# Patient Record
Sex: Female | Born: 1935 | Race: White | Hispanic: No | Smoking: Never smoker
Health system: Southern US, Community
[De-identification: ages and names within clinical notes are randomized; demographics above are authoritative.]

## PROBLEM LIST (undated history)

## (undated) DIAGNOSIS — E079 Disorder of thyroid, unspecified: Secondary | ICD-10-CM

## (undated) DIAGNOSIS — F329 Major depressive disorder, single episode, unspecified: Secondary | ICD-10-CM

## (undated) DIAGNOSIS — I10 Essential (primary) hypertension: Secondary | ICD-10-CM

## (undated) DIAGNOSIS — H409 Unspecified glaucoma: Secondary | ICD-10-CM

## (undated) DIAGNOSIS — F32A Depression, unspecified: Secondary | ICD-10-CM

## (undated) DIAGNOSIS — G459 Transient cerebral ischemic attack, unspecified: Secondary | ICD-10-CM

## (undated) HISTORY — PX: EYE SURGERY: SHX253

## (undated) HISTORY — PX: OTHER SURGICAL HISTORY: SHX169

## (undated) HISTORY — PX: APPENDECTOMY: SHX54

---

## 2013-04-12 ENCOUNTER — Emergency Department: Payer: Self-pay | Admitting: Emergency Medicine

## 2015-08-31 ENCOUNTER — Encounter: Payer: Self-pay | Admitting: Internal Medicine

## 2015-08-31 ENCOUNTER — Other Ambulatory Visit: Payer: Self-pay | Admitting: Internal Medicine

## 2015-09-18 ENCOUNTER — Encounter: Payer: Self-pay | Admitting: Internal Medicine

## 2015-09-18 ENCOUNTER — Other Ambulatory Visit: Payer: Self-pay | Admitting: Internal Medicine

## 2015-09-18 DIAGNOSIS — H409 Unspecified glaucoma: Secondary | ICD-10-CM | POA: Insufficient documentation

## 2015-09-18 DIAGNOSIS — R600 Localized edema: Secondary | ICD-10-CM | POA: Insufficient documentation

## 2015-09-18 DIAGNOSIS — E039 Hypothyroidism, unspecified: Secondary | ICD-10-CM | POA: Insufficient documentation

## 2015-09-18 DIAGNOSIS — I10 Essential (primary) hypertension: Secondary | ICD-10-CM | POA: Insufficient documentation

## 2015-09-19 ENCOUNTER — Other Ambulatory Visit: Payer: Self-pay | Admitting: Internal Medicine

## 2015-10-19 ENCOUNTER — Other Ambulatory Visit: Payer: Self-pay | Admitting: Internal Medicine

## 2015-10-29 ENCOUNTER — Other Ambulatory Visit: Payer: Self-pay | Admitting: Internal Medicine

## 2015-11-18 ENCOUNTER — Other Ambulatory Visit: Payer: Self-pay | Admitting: Internal Medicine

## 2016-01-06 ENCOUNTER — Inpatient Hospital Stay
Admission: EM | Admit: 2016-01-06 | Discharge: 2016-01-11 | DRG: 305 | Disposition: A | Discharge status: Skilled Nursing Facility | Payer: Medicare Other | Attending: Internal Medicine | Admitting: Internal Medicine

## 2016-01-06 ENCOUNTER — Emergency Department: Payer: Medicare Other

## 2016-01-06 DIAGNOSIS — F0390 Unspecified dementia without behavioral disturbance: Secondary | ICD-10-CM | POA: Diagnosis present

## 2016-01-06 DIAGNOSIS — I10 Essential (primary) hypertension: Secondary | ICD-10-CM | POA: Diagnosis not present

## 2016-01-06 DIAGNOSIS — E039 Hypothyroidism, unspecified: Secondary | ICD-10-CM | POA: Diagnosis present

## 2016-01-06 DIAGNOSIS — I639 Cerebral infarction, unspecified: Secondary | ICD-10-CM | POA: Diagnosis not present

## 2016-01-06 DIAGNOSIS — Z79899 Other long term (current) drug therapy: Secondary | ICD-10-CM

## 2016-01-06 DIAGNOSIS — Z9049 Acquired absence of other specified parts of digestive tract: Secondary | ICD-10-CM

## 2016-01-06 DIAGNOSIS — E876 Hypokalemia: Secondary | ICD-10-CM | POA: Diagnosis present

## 2016-01-06 DIAGNOSIS — Z823 Family history of stroke: Secondary | ICD-10-CM

## 2016-01-06 DIAGNOSIS — Z9889 Other specified postprocedural states: Secondary | ICD-10-CM

## 2016-01-06 DIAGNOSIS — F32A Depression, unspecified: Secondary | ICD-10-CM | POA: Diagnosis present

## 2016-01-06 DIAGNOSIS — Z66 Do not resuscitate: Secondary | ICD-10-CM | POA: Diagnosis present

## 2016-01-06 DIAGNOSIS — F329 Major depressive disorder, single episode, unspecified: Secondary | ICD-10-CM | POA: Diagnosis present

## 2016-01-06 DIAGNOSIS — H409 Unspecified glaucoma: Secondary | ICD-10-CM | POA: Diagnosis present

## 2016-01-06 DIAGNOSIS — E860 Dehydration: Secondary | ICD-10-CM | POA: Diagnosis present

## 2016-01-06 DIAGNOSIS — N390 Urinary tract infection, site not specified: Secondary | ICD-10-CM | POA: Diagnosis present

## 2016-01-06 HISTORY — DX: Unspecified glaucoma: H40.9

## 2016-01-06 HISTORY — DX: Disorder of thyroid, unspecified: E07.9

## 2016-01-06 HISTORY — DX: Depression, unspecified: F32.A

## 2016-01-06 HISTORY — DX: Major depressive disorder, single episode, unspecified: F32.9

## 2016-01-06 HISTORY — DX: Essential (primary) hypertension: I10

## 2016-01-06 MED ORDER — ONDANSETRON HCL 4 MG/2ML IJ SOLN
4.0000 mg | Freq: Once | INTRAMUSCULAR | Status: AC
  Administered 2016-01-06: 4 mg via INTRAVENOUS
  Filled 2016-01-06: qty 2

## 2016-01-06 MED ORDER — MORPHINE SULFATE (PF) 2 MG/ML IV SOLN
2.0000 mg | Freq: Once | INTRAVENOUS | Status: AC
  Administered 2016-01-06: 2 mg via INTRAVENOUS
  Filled 2016-01-06: qty 1

## 2016-01-06 NOTE — ED Provider Notes (Signed)
[facility] Emergency Department Provider Note  ____________________________________________  Time seen: 11:40 PM  I have reviewed the triage vital signs and the nursing notes.   HISTORY  Chief Complaint Headache     HPI Connie Love is a 82 y.o. female presents with acute onset of generalized headache" this morning". Patient states current pain score 7 out of 10 and located towards the top of head. Patient denies any weakness or numbness. However the patient's daughter does admit to some noted gait instability over the past few days. Patient denies any history of strokes in the past.    Past Medical History  Diagnosis Date  . Hypertension   . Thyroid disease   . Glaucoma     Patient Active Problem List   Diagnosis Date Noted  . Acquired hypothyroidism 09/18/2015  . Edema extremities 09/18/2015  . Essential (primary) hypertension 09/18/2015  . Glaucoma 09/18/2015    Past Surgical History  Procedure Laterality Date  . Bladder tack    . Appendectomy    . Eye surgery      Current Outpatient Rx  Name  Route  Sig  Dispense  Refill  . dorzolamide-timolol (COSOPT) 22.3-6.8 MG/ML ophthalmic solution      1 drop 2 (two) times daily.         Marland Kitchen levothyroxine (SYNTHROID, LEVOTHROID) 75 MCG tablet      TAKE 1 TABLET BY MOUTH DAILY   30 tablet   0   . losartan-hydrochlorothiazide (HYZAAR) 100-12.5 MG tablet      TAKE 1 TABLET BY MOUTH DAILY   30 tablet   5   . prednisoLONE acetate (PRED FORTE) 1 % ophthalmic suspension   Ophthalmic   Apply to eye.         . sertraline (ZOLOFT) 50 MG tablet   Oral   Take 50 mg by mouth daily.           Allergies No known drug allergies  Family History  Problem Relation Age of Onset  . Stroke Sister     Social History Social History  Substance Use Topics  . Smoking status: Never Smoker   . Smokeless tobacco: None  . Alcohol Use: No    Review of Systems  Constitutional: Negative  for fever. Eyes: Negative for visual changes. ENT: Negative for sore throat. Cardiovascular: Negative for chest pain. Respiratory: Negative for shortness of breath. Gastrointestinal: Negative for abdominal pain, vomiting and diarrhea. Genitourinary: Negative for dysuria. Musculoskeletal: Negative for back pain. Skin: Negative for rash. Neurological: Positive for headache and gait instability   10-point ROS otherwise negative.  ____________________________________________   PHYSICAL EXAM:  VITAL SIGNS: ED Triage Vitals  Enc Vitals Group     BP 01/06/16 1943 176/90 mmHg     Pulse Rate 01/06/16 1943 72     Resp 01/06/16 1943 19     Temp 01/06/16 1943 97.8 F (36.6 C)     Temp Source 01/06/16 1943 Oral     SpO2 01/06/16 1943 96 %     Weight 01/06/16 1943 146 lb 9.7 oz (66.5 kg)     Height --      Head Cir --      Peak Flow --      Pain Score 01/06/16 1946 6     Pain Loc --      Pain Edu? --      Excl. in GC? --      Constitutional: Alert and oriented. Apparent discomfort Eyes: Conjunctivae are  normal. PERRL. Normal extraocular movements. ENT   Head: Normocephalic and atraumatic.   Nose: No congestion/rhinnorhea.   Mouth/Throat: Mucous membranes are moist.   Neck: No stridor. Hematological/Lymphatic/Immunilogical: No cervical lymphadenopathy. Cardiovascular: Normal rate, regular rhythm. Normal and symmetric distal pulses are present in all extremities. No murmurs, rubs, or gallops. Respiratory: Normal respiratory effort without tachypnea nor retractions. Breath sounds are clear and equal bilaterally. No wheezes/rales/rhonchi. Gastrointestinal: Soft and nontender. No distention. There is no CVA tenderness. Genitourinary: deferred Musculoskeletal: Nontender with normal range of motion in all extremities. No joint effusions.  No lower extremity tenderness nor edema. Neurologic:  Normal speech and language. No gross focal neurologic deficits are appreciated.  Speech is normal.  Skin:  Skin is warm, dry and intact. No rash noted. Psychiatric: Mood and affect are normal. Speech and behavior are normal. Patient exhibits appropriate insight and judgment.  ____________________________________________    LABS (pertinent positives/negatives)  Labs Reviewed  BASIC METABOLIC PANEL - Abnormal; Notable for the following:    Sodium 127 (*)    Potassium 2.7 (*)    Chloride 92 (*)    Glucose, Bld 112 (*)    Calcium 8.5 (*)    All other components within normal limits  CBC  TROPONIN I     EKG  ED ECG REPORT I, BROWN, [place] N, the attending physician, personally viewed and interpreted this ECG.   Date: 01/07/2016  EKG Time: 12:23 AM  Rate: 70  Rhythm: Normal sinus rhythm   Axis: none  Intervals: Normal  ST&T Change: None   ____________________________________________    RADIOLOGY  CT Head Wo Contrast (Final result) Result time: 01/06/16 21:10:10   Final result by Rad Results In Interface (01/06/16 21:10:10)   Narrative:   CLINICAL DATA: Acute onset headache with nausea and photophobia  EXAM: CT HEAD WITHOUT CONTRAST  TECHNIQUE: Contiguous axial images were obtained from the base of the skull through the vertex without intravenous contrast.  COMPARISON: None.  FINDINGS: There is moderate diffuse atrophy. There is no intracranial mass, hemorrhage, extra-axial fluid collection, or midline shift. There is patchy small vessel disease in the centra semiovale bilaterally. There is evidence of prior small infarcts in each globus pallidum region. There is an infarct involving the posterior limb of the left external capsule. There is decreased attenuation involving portions of the left claustrum and extreme capsule, best appreciated on axial slice 18 series 2. This decreased attenuation is consistent with an age uncertain infarct. No other findings indicative of potential acute infarct appreciated. The bony calvarium appears  intact. The mastoid air cells are clear. Visualized orbital regions appear normal.  IMPRESSION: Age uncertain small infarct involving the left claustrum and extreme capsule. Nearby prior infarct in the left lateral basal ganglia. Small prior infarct in the inferior right posterior lentiform nucleus region. There is atrophy with moderate patchy periventricular small vessel disease. No hemorrhage or mass effect. No midline shift.   Electronically Signed By: Bretta Bang III M.D. On: 01/06/2016 21:10        INITIAL IMPRESSION / ASSESSMENT AND PLAN / ED COURSE  Pertinent labs & imaging results that were available during my care of the patient were reviewed by me and considered in my medical decision making (see chart for details).  Patient discussed with Dr. Anne Hahn for hospital admission for further evaluation and management  ____________________________________________   FINAL CLINICAL IMPRESSION(S) / ED DIAGNOSES  Final diagnoses:  Cerebral infarction due to unspecified mechanism      Darci Current, MD  01/07/16 0129 

## 2016-01-06 NOTE — ED Notes (Signed)
Pt returned from CT via stretcher.

## 2016-01-06 NOTE — ED Notes (Addendum)
Pt presents via [facility] with sudden onset headache. Pt states headache began today. States headache is on top of head, radiates down neck. Pt states nauseous, pt states light and sound make pain worse. Pt states she is almost blind so unable to tell this RN is blurred or double vision. Pt is hard of hearing. Per EMS pt has hx of HTN

## 2016-01-07 ENCOUNTER — Inpatient Hospital Stay
Admit: 2016-01-07 | Discharge: 2016-01-07 | Disposition: A | Discharge status: Home / Self Care | Payer: Medicare Other | Attending: Internal Medicine | Admitting: Internal Medicine

## 2016-01-07 ENCOUNTER — Inpatient Hospital Stay: Payer: Medicare Other

## 2016-01-07 ENCOUNTER — Encounter: Payer: Self-pay | Admitting: Internal Medicine

## 2016-01-07 DIAGNOSIS — E860 Dehydration: Secondary | ICD-10-CM | POA: Diagnosis present

## 2016-01-07 DIAGNOSIS — Z9889 Other specified postprocedural states: Secondary | ICD-10-CM | POA: Diagnosis not present

## 2016-01-07 DIAGNOSIS — I639 Cerebral infarction, unspecified: Secondary | ICD-10-CM | POA: Diagnosis present

## 2016-01-07 DIAGNOSIS — N390 Urinary tract infection, site not specified: Secondary | ICD-10-CM | POA: Diagnosis present

## 2016-01-07 DIAGNOSIS — F329 Major depressive disorder, single episode, unspecified: Secondary | ICD-10-CM | POA: Diagnosis present

## 2016-01-07 DIAGNOSIS — E039 Hypothyroidism, unspecified: Secondary | ICD-10-CM | POA: Diagnosis present

## 2016-01-07 DIAGNOSIS — H409 Unspecified glaucoma: Secondary | ICD-10-CM | POA: Diagnosis present

## 2016-01-07 DIAGNOSIS — Z79899 Other long term (current) drug therapy: Secondary | ICD-10-CM | POA: Diagnosis not present

## 2016-01-07 DIAGNOSIS — E876 Hypokalemia: Secondary | ICD-10-CM | POA: Diagnosis present

## 2016-01-07 DIAGNOSIS — Z823 Family history of stroke: Secondary | ICD-10-CM | POA: Diagnosis not present

## 2016-01-07 DIAGNOSIS — F0390 Unspecified dementia without behavioral disturbance: Secondary | ICD-10-CM | POA: Diagnosis present

## 2016-01-07 DIAGNOSIS — I10 Essential (primary) hypertension: Secondary | ICD-10-CM | POA: Diagnosis present

## 2016-01-07 DIAGNOSIS — Z66 Do not resuscitate: Secondary | ICD-10-CM | POA: Diagnosis present

## 2016-01-07 DIAGNOSIS — Z9049 Acquired absence of other specified parts of digestive tract: Secondary | ICD-10-CM | POA: Diagnosis not present

## 2016-01-07 DIAGNOSIS — F32A Depression, unspecified: Secondary | ICD-10-CM | POA: Diagnosis present

## 2016-01-07 LAB — COMPREHENSIVE METABOLIC PANEL
ALT: 15 U/L (ref 14–54)
AST: 22 U/L (ref 15–41)
Albumin: 3.9 g/dL (ref 3.5–5.0)
Alkaline Phosphatase: 65 U/L (ref 38–126)
Anion gap: 9 (ref 5–15)
BUN: 20 mg/dL (ref 6–20)
CHLORIDE: 92 mmol/L — AB (ref 101–111)
CO2: 27 mmol/L (ref 22–32)
CREATININE: 0.47 mg/dL (ref 0.44–1.00)
Calcium: 8.7 mg/dL — ABNORMAL LOW (ref 8.9–10.3)
GFR calc non Af Amer: >60 mL/min (ref >60)
Glucose, Bld: 109 mg/dL — ABNORMAL HIGH (ref 65–99)
Potassium: 3.3 mmol/L — ABNORMAL LOW (ref 3.5–5.1)
SODIUM: 128 mmol/L — AB (ref 135–145)
Total Bilirubin: 1.2 mg/dL (ref 0.3–1.2)
Total Protein: 6.4 g/dL — ABNORMAL LOW (ref 6.5–8.1)

## 2016-01-07 LAB — BASIC METABOLIC PANEL
ANION GAP: 7 (ref 5–15)
BUN: 20 mg/dL (ref 6–20)
CHLORIDE: 92 mmol/L — AB (ref 101–111)
CO2: 28 mmol/L (ref 22–32)
CREATININE: 0.49 mg/dL (ref 0.44–1.00)
Calcium: 8.5 mg/dL — ABNORMAL LOW (ref 8.9–10.3)
GFR calc non Af Amer: >60 mL/min (ref >60)
Glucose, Bld: 112 mg/dL — ABNORMAL HIGH (ref 65–99)
POTASSIUM: 2.7 mmol/L — AB (ref 3.5–5.1)
SODIUM: 127 mmol/L — AB (ref 135–145)

## 2016-01-07 LAB — LIPID PANEL
Cholesterol: 204 mg/dL — ABNORMAL HIGH (ref 0–200)
HDL: 83 mg/dL
LDL Cholesterol: 112 mg/dL — ABNORMAL HIGH (ref 0–99)
Total CHOL/HDL Ratio: 2.5 ratio
Triglycerides: 45 mg/dL
VLDL: 9 mg/dL (ref 0–40)

## 2016-01-07 LAB — CBC
HCT: 37.6 % (ref 35.0–47.0)
HCT: 37.7 % (ref 35.0–47.0)
Hemoglobin: 12.7 g/dL (ref 12.0–16.0)
Hemoglobin: 12.8 g/dL (ref 12.0–16.0)
MCH: 31.2 pg (ref 26.0–34.0)
MCH: 32.2 pg (ref 26.0–34.0)
MCHC: 33.8 g/dL (ref 32.0–36.0)
MCHC: 34 g/dL (ref 32.0–36.0)
MCV: 92.3 fL (ref 80.0–100.0)
MCV: 94.5 fL (ref 80.0–100.0)
PLATELETS: 229 10*3/uL (ref 150–440)
Platelets: 226 10*3/uL (ref 150–440)
RBC: 3.99 MIL/uL (ref 3.80–5.20)
RBC: 4.07 MIL/uL (ref 3.80–5.20)
RDW: 13.1 % (ref 11.5–14.5)
RDW: 13.3 % (ref 11.5–14.5)
WBC: 9.7 10*3/uL (ref 3.6–11.0)
WBC: 9.7 10*3/uL (ref 3.6–11.0)

## 2016-01-07 LAB — HEMOGLOBIN A1C: HEMOGLOBIN A1C: 5.5 % (ref 4.0–6.0)

## 2016-01-07 LAB — TROPONIN I

## 2016-01-07 MED ORDER — ASPIRIN 325 MG PO TABS
325.0000 mg | ORAL_TABLET | Freq: Every day | ORAL | Status: DC
  Administered 2016-01-07 – 2016-01-11 (×5): 325 mg via ORAL
  Filled 2016-01-07 (×5): qty 1

## 2016-01-07 MED ORDER — MORPHINE SULFATE (PF) 2 MG/ML IV SOLN
INTRAVENOUS | Status: AC
  Filled 2016-01-07: qty 1

## 2016-01-07 MED ORDER — LEVOTHYROXINE SODIUM 75 MCG PO TABS
75.0000 ug | ORAL_TABLET | Freq: Every day | ORAL | Status: DC
  Administered 2016-01-07 – 2016-01-11 (×5): 75 ug via ORAL
  Filled 2016-01-07 (×6): qty 1

## 2016-01-07 MED ORDER — BUTALBITAL-APAP-CAFFEINE 50-325-40 MG PO TABS
1.0000 | ORAL_TABLET | Freq: Four times a day (QID) | ORAL | Status: DC | PRN
  Administered 2016-01-08 (×2): 1 via ORAL
  Filled 2016-01-07 (×2): qty 1

## 2016-01-07 MED ORDER — DORZOLAMIDE HCL-TIMOLOL MAL 2-0.5 % OP SOLN
1.0000 [drp] | Freq: Two times a day (BID) | OPHTHALMIC | Status: DC
  Administered 2016-01-07 – 2016-01-11 (×9): 1 [drp] via OPHTHALMIC
  Filled 2016-01-07: qty 10

## 2016-01-07 MED ORDER — ACETAMINOPHEN 325 MG PO TABS
650.0000 mg | ORAL_TABLET | ORAL | Status: DC | PRN
  Administered 2016-01-07 (×2): 650 mg via ORAL
  Filled 2016-01-07: qty 2

## 2016-01-07 MED ORDER — ACETAMINOPHEN 325 MG PO TABS
650.0000 mg | ORAL_TABLET | Freq: Four times a day (QID) | ORAL | Status: DC | PRN
  Administered 2016-01-07: 10:00:00 650 mg via ORAL
  Filled 2016-01-07 (×2): qty 2

## 2016-01-07 MED ORDER — STROKE: EARLY STAGES OF RECOVERY BOOK
Freq: Once | Status: AC
  Administered 2016-01-07: 18:00:00

## 2016-01-07 MED ORDER — ATORVASTATIN CALCIUM 20 MG PO TABS
40.0000 mg | ORAL_TABLET | Freq: Every day | ORAL | Status: DC
  Administered 2016-01-07 – 2016-01-10 (×4): 40 mg via ORAL
  Filled 2016-01-07 (×4): qty 2

## 2016-01-07 MED ORDER — ENOXAPARIN SODIUM 40 MG/0.4ML ~~LOC~~ SOLN
40.0000 mg | SUBCUTANEOUS | Status: DC
  Administered 2016-01-07 – 2016-01-09 (×2): 40 mg via SUBCUTANEOUS
  Filled 2016-01-07 (×4): qty 0.4

## 2016-01-07 MED ORDER — POTASSIUM CHLORIDE 10 MEQ/100ML IV SOLN
10.0000 meq | INTRAVENOUS | Status: DC
  Administered 2016-01-07: 09:00:00 10 meq via INTRAVENOUS
  Filled 2016-01-07 (×4): qty 100

## 2016-01-07 MED ORDER — SERTRALINE HCL 50 MG PO TABS
50.0000 mg | ORAL_TABLET | Freq: Every day | ORAL | Status: DC
  Administered 2016-01-07 – 2016-01-09 (×3): 50 mg via ORAL
  Filled 2016-01-07 (×6): qty 1

## 2016-01-07 MED ORDER — ONDANSETRON HCL 4 MG/2ML IJ SOLN
4.0000 mg | Freq: Once | INTRAMUSCULAR | Status: AC
  Administered 2016-01-07: 4 mg via INTRAVENOUS

## 2016-01-07 MED ORDER — MORPHINE SULFATE (PF) 2 MG/ML IV SOLN
2.0000 mg | Freq: Once | INTRAVENOUS | Status: AC
  Administered 2016-01-07: 2 mg via INTRAVENOUS

## 2016-01-07 MED ORDER — POTASSIUM CHLORIDE CRYS ER 20 MEQ PO TBCR
20.0000 meq | EXTENDED_RELEASE_TABLET | Freq: Two times a day (BID) | ORAL | Status: DC
  Administered 2016-01-07 – 2016-01-11 (×8): 20 meq via ORAL
  Filled 2016-01-07 (×9): qty 1

## 2016-01-07 MED ORDER — PREDNISOLONE ACETATE 1 % OP SUSP
1.0000 [drp] | OPHTHALMIC | Status: DC
  Administered 2016-01-07 – 2016-01-11 (×9): 1 [drp] via OPHTHALMIC
  Filled 2016-01-07: qty 1

## 2016-01-07 MED ORDER — POTASSIUM CHLORIDE 20 MEQ PO PACK
40.0000 meq | PACK | Freq: Two times a day (BID) | ORAL | Status: DC
  Administered 2016-01-07: 40 meq via ORAL
  Filled 2016-01-07 (×2): qty 2

## 2016-01-07 MED ORDER — ONDANSETRON HCL 4 MG/2ML IJ SOLN
INTRAMUSCULAR | Status: AC
  Administered 2016-01-07: 4 mg via INTRAVENOUS
  Filled 2016-01-07: qty 2

## 2016-01-07 NOTE — Progress Notes (Signed)
[facility] - [place] at [facility]   PATIENT NAME: Connie Love    MR#:  [id number]  DATE OF BIRTH:  04-10-1922  SUBJECTIVE:  CHIEF COMPLAINT:   Chief Complaint  Patient presents with  . Headache     Came with severe headache, no Stroke on MRI.     Had Hypokalemia- resolving now.     Pt has some hearing deficit, no dementia, daughter complains of her having generalized weakness and may need placement.   REVIEW OF SYSTEMS:  CONSTITUTIONAL: No fever, fatigue or weakness.  EYES: No blurred or double vision.  EARS, NOSE, AND THROAT: No tinnitus or ear pain. Hearing deficit. RESPIRATORY: No cough, shortness of breath, wheezing or hemoptysis.  CARDIOVASCULAR: No chest pain, orthopnea, edema.  GASTROINTESTINAL: No nausea, vomiting, diarrhea or abdominal pain.  GENITOURINARY: No dysuria, hematuria.  ENDOCRINE: No polyuria, nocturia,  HEMATOLOGY: No anemia, easy bruising or bleeding SKIN: No rash or lesion. MUSCULOSKELETAL: No joint pain or arthritis.   NEUROLOGIC: No tingling, numbness, generalized weakness.  PSYCHIATRY: No anxiety or depression.   ROS  DRUG ALLERGIES:  No Known Allergies  VITALS:  Blood pressure 145/66, pulse 67, temperature 98.4 F (36.9 C), temperature source Oral, resp. rate 18, height 5\' 2"  (1.575 m), weight 61.916 kg (136 lb 8 oz), SpO2 94 %.  PHYSICAL EXAMINATION:  GENERAL:  82 y.o.-year-old patient lying in the bed with no acute distress.  EYES: Pupils equal, round, reactive to light and accommodation. No scleral icterus. Extraocular muscles intact.  HEENT: Head atraumatic, normocephalic. Oropharynx and nasopharynx clear. Hearing deficit.  NECK:  Supple, no jugular venous distention. No thyroid enlargement, no tenderness.  LUNGS: Normal breath sounds bilaterally, no wheezing, rales,rhonchi or crepitation. No use of accessory muscles of respiration.  CARDIOVASCULAR: S1, S2 normal. No murmurs, rubs, or gallops.  ABDOMEN: Soft,  nontender, nondistended. Bowel sounds present. No organomegaly or mass.  EXTREMITIES: No pedal edema, cyanosis, or clubbing.  NEUROLOGIC: Cranial nerves II through XII are intact. Muscle strength 4/5 in all extremities. Sensation intact. Gait not checked.  PSYCHIATRIC: The patient is alert and oriented x 3.  SKIN: No obvious rash, lesion, or ulcer.   Physical Exam LABORATORY PANEL:   CBC  Recent Labs Lab 01/07/16 0851  WBC 9.7  HGB 12.8  HCT 37.7  PLT 226   ------------------------------------------------------------------------------------------------------------------  Chemistries   Recent Labs Lab 01/07/16 0851  NA 128*  K 3.3*  CL 92*  CO2 27  GLUCOSE 109*  BUN 20  CREATININE 0.47  CALCIUM 8.7*  AST 22  ALT 15  ALKPHOS 65  BILITOT 1.2   ------------------------------------------------------------------------------------------------------------------  Cardiac Enzymes  Recent Labs Lab 01/07/16 0007  TROPONINI <0.03   ------------------------------------------------------------------------------------------------------------------  RADIOLOGY:  Ct Head Wo Contrast  01/06/2016  CLINICAL DATA:  Acute onset headache with nausea and photophobia EXAM: CT HEAD WITHOUT CONTRAST TECHNIQUE: Contiguous axial images were obtained from the base of the skull through the vertex without intravenous contrast. COMPARISON:  None. FINDINGS: There is moderate diffuse atrophy. There is no intracranial mass, hemorrhage, extra-axial fluid collection, or midline shift. There is patchy small vessel disease in the centra semiovale bilaterally. There is evidence of prior small infarcts in each globus pallidum region. There is an infarct involving the posterior limb of the left external capsule. There is decreased attenuation involving portions of the left claustrum and extreme capsule, best appreciated on axial slice 18 series 2. This decreased attenuation is consistent with an age uncertain  infarct. No other findings  indicative of potential acute infarct appreciated. The bony calvarium appears intact. The mastoid air cells are clear. Visualized orbital regions appear normal. IMPRESSION: Age uncertain small infarct involving the left claustrum and extreme capsule. Nearby prior infarct in the left lateral basal ganglia. Small prior infarct in the inferior right posterior lentiform nucleus region. There is atrophy with moderate patchy periventricular small vessel disease. No hemorrhage or mass effect. No midline shift. Electronically Signed   By: Bretta Bang III M.D.   On: 01/06/2016 21:10   Mr Brain Wo Contrast  01/07/2016  CLINICAL DATA:  Headaches.  Abnormal CT of the head. EXAM: MRI HEAD WITHOUT CONTRAST MRA HEAD WITHOUT CONTRAST TECHNIQUE: Multiplanar, multiecho pulse sequences of the brain and surrounding structures were obtained without intravenous contrast. Angiographic images of the head were obtained using MRA technique without contrast. COMPARISON:  CT head without contrast 01/05/2006. FINDINGS: MRI HEAD FINDINGS The diffusion-weighted images demonstrate no evidence for acute or subacute infarct. Dilated perivascular spaces and remote infarcts are evident in the inferior aspect of the lentiform nucleus and claustrum. Remote white matter changes are present in the left external capsule. Dilated perivascular spaces are present throughout both basal ganglia. Remote ischemic changes are present of ally. Moderate periventricular and subcortical white matter changes are present bilaterally. The internal auditory canals are within normal limits. Flow is present in the major intracranial arteries. Bilateral lens replacements are present. The globes and orbits are intact. Paranasal sinuses and mastoid air cells are clear. The skullbase is within normal limits. Midline sagittal images demonstrate mild atrophy of the corpus callosum. There is a relatively empty sella. No discrete lesions are  evident. MRA HEAD FINDINGS An atherosclerotic irregularities are present within the cavernous internal carotid arteries bilaterally without a significant focal stenosis. Posterior communicating arteries are present bilaterally. Mild irregularity is present in the distal M1 segments. This is somewhat exaggerated by patient motion. There is moderate attenuation of distal MCA branch vessels bilaterally without a significant proximal stenosis or occlusion on the right. There is mild-to-moderate narrowing of the superior left M2 segment after an initial inferior and to take golf. The right vertebral artery is the dominant vessel. There is moderate narrowing at the origin of the PICA vessels bilaterally. The posterior she  the the basilar artery is normal. There is mild narrowing of the proximal P1 segments bilaterally. Prominent posterior communicating arteries are present bilaterally. There is moderate signal loss in the distal P2 segments bilaterally. This is exaggerated by patient motion through this area. IMPRESSION: 1. No acute intracranial abnormality. Specifically, no evidence for acute left basal ganglia infarct. 2. Remote lacunar infarcts in dilated perivascular spaces are present throughout the basal ganglia bilaterally, particularly within the inferior left lentiform nucleus and external capsule. 3. Moderate diffuse periventricular and subcortical white matter changes bilaterally. These correspond with extensive distal media min small vessel disease on the MRA. 4. Moderate narrowing of the superior division proximal left M2 segment. 5. Moderate narrowing of the PICA origins bilaterally. 6. No other significant proximal stenosis, aneurysm, or branch vessel occlusion. Electronically Signed   By: Marin Roberts M.D.   On: 01/07/2016 13:02   US Carotid Bilateral  01/07/2016  CLINICAL DATA:  82 year old female with stroke-like symptoms and a severe headache EXAM: BILATERAL CAROTID DUPLEX ULTRASOUND  TECHNIQUE: Wallace Cullens scale imaging, color Doppler and duplex ultrasound were performed of bilateral carotid and vertebral arteries in the neck. COMPARISON:  Brain MRI 01/07/16 FINDINGS: Criteria: Quantification of carotid stenosis is based on velocity parameters that  correlate the residual internal carotid diameter with NASCET-based stenosis levels, using the diameter of the distal internal carotid lumen as the denominator for stenosis measurement. The following velocity measurements were obtained: RIGHT ICA:  63/16 cm/sec CCA:  72/11 cm/sec SYSTOLIC ICA/CCA RATIO:  0.9 DIASTOLIC ICA/CCA RATIO:  1.9 ECA:  117 cm/sec LEFT ICA:  63/17 cm/sec CCA:  77/12 cm/sec SYSTOLIC ICA/CCA RATIO:  0.8 DIASTOLIC ICA/CCA RATIO:  1.7 ECA:  131 cm/sec RIGHT CAROTID ARTERY: Trace atherosclerotic plaque in the carotid bulb without evidence of stenosis. RIGHT VERTEBRAL ARTERY:  Patent with antegrade flow. LEFT CAROTID ARTERY: Trace atherosclerotic plaque in the carotid bulb without evidence of stenosis. LEFT VERTEBRAL ARTERY:  Patent with normal antegrade flow. IMPRESSION: 1. Trace atherosclerotic plaque in the carotid bifurcations bilaterally without evidence of stenosis. 2. Vertebral arteries are patent with antegrade flow. Signed, Sterling Big, MD [facility] Electronically Signed   By: Malachy Moan M.D.   On: 01/07/2016 14:30   Mr Maxine Glenn Head/brain Wo Cm  01/07/2016  CLINICAL DATA:  Headaches.  Abnormal CT of the head. EXAM: MRI HEAD WITHOUT CONTRAST MRA HEAD WITHOUT CONTRAST TECHNIQUE: Multiplanar, multiecho pulse sequences of the brain and surrounding structures were obtained without intravenous contrast. Angiographic images of the head were obtained using MRA technique without contrast. COMPARISON:  CT head without contrast 01/05/2006. FINDINGS: MRI HEAD FINDINGS The diffusion-weighted images demonstrate no evidence for acute or subacute infarct. Dilated perivascular  spaces and remote infarcts are evident in the inferior aspect of the lentiform nucleus and claustrum. Remote white matter changes are present in the left external capsule. Dilated perivascular spaces are present throughout both basal ganglia. Remote ischemic changes are present of ally. Moderate periventricular and subcortical white matter changes are present bilaterally. The internal auditory canals are within normal limits. Flow is present in the major intracranial arteries. Bilateral lens replacements are present. The globes and orbits are intact. Paranasal sinuses and mastoid air cells are clear. The skullbase is within normal limits. Midline sagittal images demonstrate mild atrophy of the corpus callosum. There is a relatively empty sella. No discrete lesions are evident. MRA HEAD FINDINGS An atherosclerotic irregularities are present within the cavernous internal carotid arteries bilaterally without a significant focal stenosis. Posterior communicating arteries are present bilaterally. Mild irregularity is present in the distal M1 segments. This is somewhat exaggerated by patient motion. There is moderate attenuation of distal MCA branch vessels bilaterally without a significant proximal stenosis or occlusion on the right. There is mild-to-moderate narrowing of the superior left M2 segment after an initial inferior and to take golf. The right vertebral artery is the dominant vessel. There is moderate narrowing at the origin of the PICA vessels bilaterally. The posterior she  the the basilar artery is normal. There is mild narrowing of the proximal P1 segments bilaterally. Prominent posterior communicating arteries are present bilaterally. There is moderate signal loss in the distal P2 segments bilaterally. This is exaggerated by patient motion through this area. IMPRESSION: 1. No acute intracranial abnormality. Specifically, no evidence for acute left basal ganglia infarct. 2. Remote lacunar infarcts in  dilated perivascular spaces are present throughout the basal ganglia bilaterally, particularly within the inferior left lentiform nucleus and external capsule. 3. Moderate diffuse periventricular and subcortical white matter changes bilaterally. These correspond with extensive distal media min small vessel disease on the MRA. 4. Moderate narrowing of the superior division proximal left M2 segment. 5. Moderate narrowing of the PICA origins bilaterally. 6. No other  significant proximal stenosis, aneurysm, or branch vessel occlusion. Electronically Signed   By: Marin Roberts M.D.   On: 01/07/2016 13:02    ASSESSMENT AND PLAN:   Principal Problem:   Stroke (cerebrum) (HCC) Active Problems:   Acquired hypothyroidism   Essential (primary) hypertension   Glaucoma   Depression   Hypokalemia   Stroke (HCC)  * Headache- suspected stroke- ruled out.   Likely was due to Hypertension.   MRI, and carotid doppler are negative.   Give tylenol and fioricet as needed.  * Essential hypertension   Under control now.    Stop HCTz on dc. May cont Losartan.   * hypokalemia    Was taking Hyzaar- need to stop HCTZ on d/c.    Replaced.  *   Hypothyroidism   Cont replacement  * Glaucoma   Cont Eye drops.   All the records are reviewed and case discussed with Care Management/Social Workerr. Management plans discussed with the patient, family and they are in agreement.  CODE STATUS: DNR  TOTAL TIME TAKING CARE OF THIS PATIENT: 35 minutes.  Discussed with her daughters, she is worried about her generalized weakness.  Get PT eval.   POSSIBLE D/C IN 1-2 DAYS, DEPENDING ON CLINICAL CONDITION.   Altamese Dilling M.D on 01/07/2016   Between 7am to 6pm - Pager - [phone]  After 6pm go to [email or web address] - password [email or web address]  [facility]  Office  [phone]  CC: Primary care physician; Pcp Not In System  Note: This dictation was prepared with Dragon dictation  along with smaller phrase technology. Any transcriptional errors that result from this process are unintentional.

## 2016-01-07 NOTE — Progress Notes (Signed)
*  PRELIMINARY RESULTS* Echocardiogram 2D Echocardiogram has been performed.  Georgann Housekeeper Hege 01/07/2016, 1:01 PM

## 2016-01-07 NOTE — Progress Notes (Signed)
PT Cancellation Note  Patient Details Name: Connie Love MRN: [id number] DOB: 1922/08/27   Cancelled Treatment:    Reason Eval/Treat Not Completed: Other (comment). Pt not feeling well at all per RN. PT order for start tomorrow. Will plan to attempt next date.   Remedy Corporan 01/07/2016, 11:03 AM Elizabeth Palau, PT, DPT [phone]

## 2016-01-07 NOTE — Progress Notes (Signed)
Assumed care of patient from Donivan Scull, RN at [phone]. Bo Mcclintock, RN

## 2016-01-07 NOTE — ED Notes (Signed)
Daughter states pt has been unable to drink the rest of potassium drunk d/t fear of nausea returning. Pt is resting now. Will continue to encourage drinking the rest of potassium drink.

## 2016-01-07 NOTE — ED Notes (Signed)
Pt drank about half of potassium drink. Daughter was instructed to encourage pt to drink rest of potassium drink when pt is ready for more. This RN will monitor pt's progress.

## 2016-01-07 NOTE — H&P (Signed)
[facility] - [place] at [facility]   PATIENT NAME: Connie Love    MR#:  [id number]  DATE OF BIRTH:  [birth year]  DATE OF ADMISSION:  01/06/2016  PRIMARY CARE PHYSICIAN: Pcp Not In System   REQUESTING/REFERRING PHYSICIAN: Manson Passey, MD  CHIEF COMPLAINT:   Chief Complaint  Patient presents with  . Headache    HISTORY OF PRESENT ILLNESS:  Connie Love  is a 82 y.o. female who presents with headache. Patient is unable to give much history, but she came to the ED for very persistent headache. This headache is frontal, and has not been alleviated by over-the-counter medications at home, or initial analgesics here in the ED. Workup in the ED showed old strokes and a suspected new, though read radiographically is age indeterminate stroke. Patient is unable to give much more history, though she denies any focal weakness or sensory deficits. Hospitalists were called for evaluation and workup for stroke.  PAST MEDICAL HISTORY:   Past Medical History  Diagnosis Date  . Hypertension   . Thyroid disease   . Glaucoma   . Depression     PAST SURGICAL HISTORY:   Past Surgical History  Procedure Laterality Date  . Bladder tack    . Appendectomy    . Eye surgery      SOCIAL HISTORY:   Social History  Substance Use Topics  . Smoking status: Never Smoker   . Smokeless tobacco: Not on file  . Alcohol Use: No    FAMILY HISTORY:   Family History  Problem Relation Age of Onset  . Stroke Sister     DRUG ALLERGIES:  No Known Allergies  MEDICATIONS AT HOME:   Prior to Admission medications   Medication Sig Start Date End Date Taking? Authorizing Provider  dorzolamide-timolol (COSOPT) 22.3-6.8 MG/ML ophthalmic solution 1 drop 2 (two) times daily.   Yes Historical Provider, MD  levothyroxine (SYNTHROID, LEVOTHROID) 75 MCG tablet TAKE 1 TABLET BY MOUTH DAILY 10/29/15  Yes Reubin Milan, MD  losartan-hydrochlorothiazide [facility]) 100-12.5 MG tablet TAKE 1  TABLET BY MOUTH DAILY 11/18/15  Yes Reubin Milan, MD  prednisoLONE acetate (PRED FORTE) 1 % ophthalmic suspension Apply to eye.   Yes Historical Provider, MD  sertraline (ZOLOFT) 50 MG tablet Take 50 mg by mouth daily.   Yes Historical Provider, MD    REVIEW OF SYSTEMS:  Review of Systems  Constitutional: Negative for fever, chills, weight loss and malaise/fatigue.  HENT: Positive for hearing loss (chronic and stable). Negative for ear pain and tinnitus.   Eyes: Negative for blurred vision, double vision, pain and redness.  Respiratory: Negative for cough, hemoptysis and shortness of breath.   Cardiovascular: Negative for chest pain, palpitations, orthopnea and leg swelling.  Gastrointestinal: Negative for nausea, vomiting, abdominal pain, diarrhea and constipation.  Genitourinary: Negative for dysuria, frequency and hematuria.  Musculoskeletal: Negative for back pain, joint pain and neck pain.  Skin:       No acne, rash, or lesions  Neurological: Positive for headaches. Negative for dizziness, tremors, focal weakness and weakness.  Endo/Heme/Allergies: Negative for polydipsia. Does not bruise/bleed easily.  Psychiatric/Behavioral: Negative for depression. The patient is not nervous/anxious and does not have insomnia.      VITAL SIGNS:   Filed Vitals:   01/07/16 0030 01/07/16 0100 01/07/16 0130 01/07/16 0200  BP: 139/76 132/69 148/83 140/79  Pulse: 69 68 77 72  Temp:      TempSrc:      Resp: 13 13 12  14  Weight:      SpO2: 95% 93% 96% 96%   Wt Readings from Last 3 Encounters:  01/06/16 66.5 kg (146 lb 9.7 oz)    PHYSICAL EXAMINATION:  Physical Exam  Vitals reviewed. Constitutional: She is oriented to person, place, and time. She appears well-developed and well-nourished. No distress.  HENT:  Head: Normocephalic and atraumatic.  Mouth/Throat: Oropharynx is clear and moist.  Hard of hearing  Eyes: Conjunctivae and EOM are normal. Pupils are equal, round, and reactive to  light. No scleral icterus.  Neck: Normal range of motion. Neck supple. No JVD present. No thyromegaly present.  Cardiovascular: Normal rate, regular rhythm and intact distal pulses.  Exam reveals no gallop and no friction rub.   No murmur heard. Respiratory: Effort normal and breath sounds normal. No respiratory distress. She has no wheezes. She has no rales.  GI: Soft. Bowel sounds are normal. She exhibits no distension. There is no tenderness.  Musculoskeletal: Normal range of motion. She exhibits no edema.  No arthritis, no gout  Lymphadenopathy:    She has no cervical adenopathy.  Neurological: She is alert and oriented to person, place, and time. No cranial nerve deficit.  No dysarthria, no aphasia  Skin: Skin is warm and dry. No rash noted. No erythema.  Psychiatric: She has a normal mood and affect. Her behavior is normal. Judgment and thought content normal.    LABORATORY PANEL:   CBC  Recent Labs Lab 01/07/16 0007  WBC 9.7  HGB 12.7  HCT 37.6  PLT 229   ------------------------------------------------------------------------------------------------------------------  Chemistries   Recent Labs Lab 01/07/16 0007  NA 127*  K 2.7*  CL 92*  CO2 28  GLUCOSE 112*  BUN 20  CREATININE 0.49  CALCIUM 8.5*   ------------------------------------------------------------------------------------------------------------------  Cardiac Enzymes  Recent Labs Lab 01/07/16 0007  TROPONINI <0.03   ------------------------------------------------------------------------------------------------------------------  RADIOLOGY:  Ct Head Wo Contrast  01/06/2016  CLINICAL DATA:  Acute onset headache with nausea and photophobia EXAM: CT HEAD WITHOUT CONTRAST TECHNIQUE: Contiguous axial images were obtained from the base of the skull through the vertex without intravenous contrast. COMPARISON:  None. FINDINGS: There is moderate diffuse atrophy. There is no intracranial mass,  hemorrhage, extra-axial fluid collection, or midline shift. There is patchy small vessel disease in the centra semiovale bilaterally. There is evidence of prior small infarcts in each globus pallidum region. There is an infarct involving the posterior limb of the left external capsule. There is decreased attenuation involving portions of the left claustrum and extreme capsule, best appreciated on axial slice 18 series 2. This decreased attenuation is consistent with an age uncertain infarct. No other findings indicative of potential acute infarct appreciated. The bony calvarium appears intact. The mastoid air cells are clear. Visualized orbital regions appear normal. IMPRESSION: Age uncertain small infarct involving the left claustrum and extreme capsule. Nearby prior infarct in the left lateral basal ganglia. Small prior infarct in the inferior right posterior lentiform nucleus region. There is atrophy with moderate patchy periventricular small vessel disease. No hemorrhage or mass effect. No midline shift. Electronically Signed   By: Bretta Bang III M.D.   On: 01/06/2016 21:10    EKG:   Orders placed or performed during the hospital encounter of 01/06/16  . EKG 12-Lead  . EKG 12-Lead  . ED EKG  . ED EKG  . EKG 12-Lead  . EKG 12-Lead    IMPRESSION AND PLAN:  Principal Problem:   Stroke (cerebrum) (HCC) - admit and pursue  workup for stroke order set. Specifically including MRI/MRA head, carotid Dopplers, lipids, A1c, echocardiogram, therapy evaluations, and neurology consult. Active Problems:   Essential (primary) hypertension - controlled, continue home meds   Hypokalemia - replaced in the ED, we will recheck and monitor, replete as needed   Acquired hypothyroidism - home dose of thyroid replacement   Glaucoma - continue home eyedrops   Depression - continue home meds  All the records are reviewed and case discussed with ED provider. Management plans discussed with the patient and/or  family.  DVT PROPHYLAXIS: SubQ lovenox  GI PROPHYLAXIS: None  ADMISSION STATUS: Inpatient  CODE STATUS: DNR Code Status History    This patient does not have a recorded code status. Please follow your organizational policy for patients in this situation.    Advance Directive Documentation        Most Recent Value   Type of Advance Directive  Out of facility DNR (pink MOST or yellow form), Healthcare Power of Attorney [Carol Jessee]   Pre-existing out of facility DNR order (yellow form or pink MOST form)  Yellow form placed in chart (order not valid for inpatient use), Pink MOST form placed in chart (order not valid for inpatient use)   "MOST" Form in Place?        TOTAL TIME TAKING CARE OF THIS PATIENT: 50 minutes.    Alyshia Kernan FIELDING 01/07/2016, 3:33 AM  [facility]  Office  [phone]  CC: Primary care physician; Pcp Not In System

## 2016-01-07 NOTE — Evaluation (Signed)
Clinical/Bedside Swallow Evaluation Patient Details  Name: Connie Love MRN: [id number] Date of Birth: March 03, 1922  Today's Date: 01/07/2016 Time: SLP Start Time (ACUTE ONLY): 0915 SLP Stop Time (ACUTE ONLY): 1015 SLP Time Calculation (min) (ACUTE ONLY): 60 min  Past Medical History:  Past Medical History  Diagnosis Date  . Hypertension   . Thyroid disease   . Glaucoma   . Depression    Past Surgical History:  Past Surgical History  Procedure Laterality Date  . Bladder tack    . Appendectomy    . Eye surgery     HPI:  Pt is a 82 y.o. female w/ h/o HTN, thyroid issues, and depression who presents with headache. Patient is unable to give much history, but she came to the ED for very persistent headache. This headache is frontal, and has not been alleviated by over-the-counter medications at home, or initial analgesics here in the ED. Workup in the ED showed old strokes and a suspected new, though read radiographically is age indeterminate stroke. Patient is unable to give much more history, though she denies any focal weakness or sensory deficits. Pt denies any speech-language deficits and conversed w/ SLP and Dtr appropriately; NSG agreed. Pt also denies any swallowing problems stating she has swallowed pills using water w/ NSG; NSG agreed. Pt is currently on a regular diet and ate minimally at breakfast d/t n/v feelings; NSG aware.    Assessment / Plan / Recommendation Clinical Impression  Pt appeared to adequately tolerate trials of thin liquids and purees during evaluation and tolerated soft solids w/ no reported problems swallowing such foods at morning meal(per pt, Dtr and staff). Pt exhibited no oral phase deficits w/ po trials as well. She required assistance for feeding d/t vision deficits; followed general aspiration precautions independently. Pt appears at reduced risk for aspiration w/ po's currently; follows aspiration precautions baseline to include small sips when drinking.  Rec. continue w/ current diet as ordered w/ precautions. Pt/family agreed. Discussed use of puree w/ large pills; avoiding large straw w/ Dtr. Pt is severely HOH.     Aspiration Risk   (reduced following aspiration precautions)    Diet Recommendation  regular w/ meats cut and moistened well; thin liquids; aspiration precautions; feeding assistance sec. To vision deficits. Pt is HOH.  Medication Administration: Whole meds with liquid (use of puree if easier)    Other  Recommendations Recommended Consults:  (Dietician as Arther Dames.) Oral Care Recommendations: Oral care BID;Staff/trained caregiver to provide oral care   Follow up Recommendations   (TBD by PT)    Frequency and Duration            Prognosis Prognosis for Safe Diet Advancement: Good Barriers to Reach Goals:  (feelings of n/v)      Swallow Study   General Date of Onset: 01/06/16 HPI: Pt is a 82 y.o. female w/ h/o HTN, thyroid issues, and depression who presents with headache. Patient is unable to give much history, but she came to the ED for very persistent headache. This headache is frontal, and has not been alleviated by over-the-counter medications at home, or initial analgesics here in the ED. Workup in the ED showed old strokes and a suspected new, though read radiographically is age indeterminate stroke. Patient is unable to give much more history, though she denies any focal weakness or sensory deficits. Pt denies any speech-language deficits and conversed w/ SLP and Dtr appropriately; NSG agreed. Pt also denies any swallowing problems stating she has swallowed pills  using water w/ NSG; NSG agreed. Pt is currently on a regular diet and ate minimally at breakfast d/t n/v feelings; NSG aware.  Type of Study: Bedside Swallow Evaluation Previous Swallow Assessment: none Diet Prior to this Study: Regular;Thin liquids Temperature Spikes Noted: No (wbc 9.7) Respiratory Status: Room air History of Recent Intubation:  No Behavior/Cognition: Alert;Cooperative;Pleasant mood Oral Cavity Assessment: Within Functional Limits Oral Care Completed by SLP: Recent completion by staff Oral Cavity - Dentition: Adequate natural dentition Vision: Impaired for self-feeding (min.+) Self-Feeding Abilities: Able to feed self;Needs assist;Needs set up (vision deficits impact self-feeding) Patient Positioning: Upright in bed Baseline Vocal Quality: Normal Volitional Cough: Strong Volitional Swallow: Able to elicit    Oral/Motor/Sensory Function Overall Oral Motor/Sensory Function: Within functional limits   Ice Chips Ice chips: Not tested   Thin Liquid Thin Liquid: Within functional limits Presentation: Straw;Cup (assisted w/ holding cup; 3 trials w/ each)    Nectar Thick Nectar Thick Liquid: Not tested   Honey Thick Honey Thick Liquid: Not tested   Puree Puree: Within functional limits Presentation: Spoon (fed; 6 trials)   Solid   GO   Solid: Not tested Other Comments: declined d/t feelings of n/v        Jerilynn Som, MS, CCC-SLP  Watson,Connie Love 01/07/2016,4:09 PM

## 2016-01-08 LAB — MAGNESIUM: MAGNESIUM: 2.1 mg/dL (ref 1.7–2.4)

## 2016-01-08 LAB — BASIC METABOLIC PANEL
Anion gap: 5 (ref 5–15)
BUN: 16 mg/dL (ref 6–20)
CALCIUM: 8.8 mg/dL — AB (ref 8.9–10.3)
CO2: 30 mmol/L (ref 22–32)
CREATININE: 0.46 mg/dL (ref 0.44–1.00)
Chloride: 95 mmol/L — ABNORMAL LOW (ref 101–111)
GFR calc non Af Amer: >60 mL/min (ref >60)
Glucose, Bld: 106 mg/dL — ABNORMAL HIGH (ref 65–99)
Potassium: 3.3 mmol/L — ABNORMAL LOW (ref 3.5–5.1)
SODIUM: 130 mmol/L — AB (ref 135–145)

## 2016-01-08 LAB — AMMONIA: AMMONIA: 19 umol/L (ref 9–35)

## 2016-01-08 LAB — TSH: TSH: 4.964 u[IU]/mL — ABNORMAL HIGH (ref 0.350–4.500)

## 2016-01-08 MED ORDER — KETOROLAC TROMETHAMINE 15 MG/ML IJ SOLN
15.0000 mg | Freq: Four times a day (QID) | INTRAMUSCULAR | Status: DC | PRN
  Filled 2016-01-08: qty 1

## 2016-01-08 MED ORDER — LOSARTAN POTASSIUM 50 MG PO TABS
100.0000 mg | ORAL_TABLET | Freq: Every day | ORAL | Status: DC
  Administered 2016-01-08 – 2016-01-11 (×4): 100 mg via ORAL
  Filled 2016-01-08 (×4): qty 2

## 2016-01-08 MED ORDER — HALOPERIDOL LACTATE 5 MG/ML IJ SOLN
5.0000 mg | Freq: Once | INTRAMUSCULAR | Status: AC
  Administered 2016-01-08: 06:00:00 5 mg via INTRAMUSCULAR
  Filled 2016-01-08: qty 1

## 2016-01-08 MED ORDER — POTASSIUM CHLORIDE CRYS ER 20 MEQ PO TBCR
40.0000 meq | EXTENDED_RELEASE_TABLET | Freq: Once | ORAL | Status: AC
  Administered 2016-01-08: 16:00:00 40 meq via ORAL
  Filled 2016-01-08: qty 2

## 2016-01-08 NOTE — Progress Notes (Signed)
[facility] - [place] at [facility]   PATIENT NAME: Connie Love    MR#:  [id number]  DATE OF BIRTH:  27-Jan-1922  SUBJECTIVE:  CHIEF COMPLAINT:   Chief Complaint  Patient presents with  . Headache     Came with severe headache, no Stroke on MRI.     Headache resolved. Was confused earlier and got haldol.   REVIEW OF SYSTEMS:    Review of Systems  Unable to perform ROS: dementia    DRUG ALLERGIES:  No Known Allergies  VITALS:  Blood pressure 153/73, pulse 64, temperature 98.4 F (36.9 C), temperature source Oral, resp. rate 18, height  (1.575 m), weight 61.916 kg (136 lb 8 oz), SpO2 95 %.  PHYSICAL EXAMINATION:  GENERAL:  82 y.o.-year-old patient lying in the bed with no acute distress.  EYES: Pupils equal, round, reactive to light and accommodation. No scleral icterus. Extraocular muscles intact.  HEENT: Head atraumatic, normocephalic. Oropharynx and nasopharynx clear. Hearing deficit.  NECK:  Supple, no jugular venous distention. No thyroid enlargement, no tenderness.  LUNGS: Normal breath sounds bilaterally, no wheezing, rales,rhonchi or crepitation. No use of accessory muscles of respiration.  CARDIOVASCULAR: S1, S2 normal. No murmurs, rubs, or gallops.  ABDOMEN: Soft, nontender, nondistended. Bowel sounds present. No organomegaly or mass.  EXTREMITIES: No pedal edema, cyanosis, or clubbing.  NEUROLOGIC: Cranial nerves II through XII are intact. Muscle strength 4/5 in all extremities. Sensation intact. Gait not checked.  PSYCHIATRIC: The patient is drowzy SKIN: No obvious rash, lesion, or ulcer.   Physical Exam LABORATORY PANEL:   CBC  Recent Labs Lab 01/07/16 0851  WBC 9.7  HGB 12.8  HCT 37.7  PLT 226   ------------------------------------------------------------------------------------------------------------------  Chemistries   Recent Labs Lab 01/07/16 0851 01/08/16 0910  NA 128* 130*  K 3.3* 3.3*  CL 92* 95*  CO2  27 30  GLUCOSE 109* 106*  BUN 20 16  CREATININE 0.47 0.46  CALCIUM 8.7* 8.8*  AST 22  --   ALT 15  --   ALKPHOS 65  --   BILITOT 1.2  --    ------------------------------------------------------------------------------------------------------------------  Cardiac Enzymes  Recent Labs Lab 01/07/16 0007  TROPONINI <0.03   ------------------------------------------------------------------------------------------------------------------  RADIOLOGY:  Ct Head Wo Contrast  01/06/2016  CLINICAL DATA:  Acute onset headache with nausea and photophobia EXAM: CT HEAD WITHOUT CONTRAST TECHNIQUE: Contiguous axial images were obtained from the base of the skull through the vertex without intravenous contrast. COMPARISON:  None. FINDINGS: There is moderate diffuse atrophy. There is no intracranial mass, hemorrhage, extra-axial fluid collection, or midline shift. There is patchy small vessel disease in the centra semiovale bilaterally. There is evidence of prior small infarcts in each globus pallidum region. There is an infarct involving the posterior limb of the left external capsule. There is decreased attenuation involving portions of the left claustrum and extreme capsule, best appreciated on axial slice 18 series 2. This decreased attenuation is consistent with an age uncertain infarct. No other findings indicative of potential acute infarct appreciated. The bony calvarium appears intact. The mastoid air cells are clear. Visualized orbital regions appear normal. IMPRESSION: Age uncertain small infarct involving the left claustrum and extreme capsule. Nearby prior infarct in the left lateral basal ganglia. Small prior infarct in the inferior right posterior lentiform nucleus region. There is atrophy with moderate patchy periventricular small vessel disease. No hemorrhage or mass effect. No midline shift. Electronically Signed   By: Bretta Bang III M.D.   On:  01/06/2016 21:10   Mr Brain Wo  Contrast  01/07/2016  CLINICAL DATA:  Headaches.  Abnormal CT of the head. EXAM: MRI HEAD WITHOUT CONTRAST MRA HEAD WITHOUT CONTRAST TECHNIQUE: Multiplanar, multiecho pulse sequences of the brain and surrounding structures were obtained without intravenous contrast. Angiographic images of the head were obtained using MRA technique without contrast. COMPARISON:  CT head without contrast 01/05/2006. FINDINGS: MRI HEAD FINDINGS The diffusion-weighted images demonstrate no evidence for acute or subacute infarct. Dilated perivascular spaces and remote infarcts are evident in the inferior aspect of the lentiform nucleus and claustrum. Remote white matter changes are present in the left external capsule. Dilated perivascular spaces are present throughout both basal ganglia. Remote ischemic changes are present of ally. Moderate periventricular and subcortical white matter changes are present bilaterally. The internal auditory canals are within normal limits. Flow is present in the major intracranial arteries. Bilateral lens replacements are present. The globes and orbits are intact. Paranasal sinuses and mastoid air cells are clear. The skullbase is within normal limits. Midline sagittal images demonstrate mild atrophy of the corpus callosum. There is a relatively empty sella. No discrete lesions are evident. MRA HEAD FINDINGS An atherosclerotic irregularities are present within the cavernous internal carotid arteries bilaterally without a significant focal stenosis. Posterior communicating arteries are present bilaterally. Mild irregularity is present in the distal M1 segments. This is somewhat exaggerated by patient motion. There is moderate attenuation of distal MCA branch vessels bilaterally without a significant proximal stenosis or occlusion on the right. There is mild-to-moderate narrowing of the superior left M2 segment after an initial inferior and to take golf. The right vertebral artery is the dominant vessel.  There is moderate narrowing at the origin of the PICA vessels bilaterally. The posterior she  the the basilar artery is normal. There is mild narrowing of the proximal P1 segments bilaterally. Prominent posterior communicating arteries are present bilaterally. There is moderate signal loss in the distal P2 segments bilaterally. This is exaggerated by patient motion through this area. IMPRESSION: 1. No acute intracranial abnormality. Specifically, no evidence for acute left basal ganglia infarct. 2. Remote lacunar infarcts in dilated perivascular spaces are present throughout the basal ganglia bilaterally, particularly within the inferior left lentiform nucleus and external capsule. 3. Moderate diffuse periventricular and subcortical white matter changes bilaterally. These correspond with extensive distal media min small vessel disease on the MRA. 4. Moderate narrowing of the superior division proximal left M2 segment. 5. Moderate narrowing of the PICA origins bilaterally. 6. No other significant proximal stenosis, aneurysm, or branch vessel occlusion. Electronically Signed   By: Marin Roberts M.D.   On: 01/07/2016 13:02   US Carotid Bilateral  01/07/2016  CLINICAL DATA:  82 year old female with stroke-like symptoms and a severe headache EXAM: BILATERAL CAROTID DUPLEX ULTRASOUND TECHNIQUE: Wallace Cullens scale imaging, color Doppler and duplex ultrasound were performed of bilateral carotid and vertebral arteries in the neck. COMPARISON:  Brain MRI 01/07/16 FINDINGS: Criteria: Quantification of carotid stenosis is based on velocity parameters that correlate the residual internal carotid diameter with NASCET-based stenosis levels, using the diameter of the distal internal carotid lumen as the denominator for stenosis measurement. The following velocity measurements were obtained: RIGHT ICA:  63/16 cm/sec CCA:  72/11 cm/sec SYSTOLIC ICA/CCA RATIO:  0.9 DIASTOLIC ICA/CCA RATIO:  1.9 ECA:  117 cm/sec LEFT ICA:  63/17  cm/sec CCA:  77/12 cm/sec SYSTOLIC ICA/CCA RATIO:  0.8 DIASTOLIC ICA/CCA RATIO:  1.7 ECA:  131 cm/sec RIGHT CAROTID ARTERY: Trace atherosclerotic plaque in  the carotid bulb without evidence of stenosis. RIGHT VERTEBRAL ARTERY:  Patent with antegrade flow. LEFT CAROTID ARTERY: Trace atherosclerotic plaque in the carotid bulb without evidence of stenosis. LEFT VERTEBRAL ARTERY:  Patent with normal antegrade flow. IMPRESSION: 1. Trace atherosclerotic plaque in the carotid bifurcations bilaterally without evidence of stenosis. 2. Vertebral arteries are patent with antegrade flow. Signed, Sterling Big, MD [facility] Electronically Signed   By: Malachy Moan M.D.   On: 01/07/2016 14:30   Mr Maxine Glenn Head/brain Wo Cm  01/07/2016  CLINICAL DATA:  Headaches.  Abnormal CT of the head. EXAM: MRI HEAD WITHOUT CONTRAST MRA HEAD WITHOUT CONTRAST TECHNIQUE: Multiplanar, multiecho pulse sequences of the brain and surrounding structures were obtained without intravenous contrast. Angiographic images of the head were obtained using MRA technique without contrast. COMPARISON:  CT head without contrast 01/05/2006. FINDINGS: MRI HEAD FINDINGS The diffusion-weighted images demonstrate no evidence for acute or subacute infarct. Dilated perivascular spaces and remote infarcts are evident in the inferior aspect of the lentiform nucleus and claustrum. Remote white matter changes are present in the left external capsule. Dilated perivascular spaces are present throughout both basal ganglia. Remote ischemic changes are present of ally. Moderate periventricular and subcortical white matter changes are present bilaterally. The internal auditory canals are within normal limits. Flow is present in the major intracranial arteries. Bilateral lens replacements are present. The globes and orbits are intact. Paranasal sinuses and mastoid air cells are clear. The skullbase is within  normal limits. Midline sagittal images demonstrate mild atrophy of the corpus callosum. There is a relatively empty sella. No discrete lesions are evident. MRA HEAD FINDINGS An atherosclerotic irregularities are present within the cavernous internal carotid arteries bilaterally without a significant focal stenosis. Posterior communicating arteries are present bilaterally. Mild irregularity is present in the distal M1 segments. This is somewhat exaggerated by patient motion. There is moderate attenuation of distal MCA branch vessels bilaterally without a significant proximal stenosis or occlusion on the right. There is mild-to-moderate narrowing of the superior left M2 segment after an initial inferior and to take golf. The right vertebral artery is the dominant vessel. There is moderate narrowing at the origin of the PICA vessels bilaterally. The posterior she  the the basilar artery is normal. There is mild narrowing of the proximal P1 segments bilaterally. Prominent posterior communicating arteries are present bilaterally. There is moderate signal loss in the distal P2 segments bilaterally. This is exaggerated by patient motion through this area. IMPRESSION: 1. No acute intracranial abnormality. Specifically, no evidence for acute left basal ganglia infarct. 2. Remote lacunar infarcts in dilated perivascular spaces are present throughout the basal ganglia bilaterally, particularly within the inferior left lentiform nucleus and external capsule. 3. Moderate diffuse periventricular and subcortical white matter changes bilaterally. These correspond with extensive distal media min small vessel disease on the MRA. 4. Moderate narrowing of the superior division proximal left M2 segment. 5. Moderate narrowing of the PICA origins bilaterally. 6. No other significant proximal stenosis, aneurysm, or branch vessel occlusion. Electronically Signed   By: Marin Roberts M.D.   On: 01/07/2016 13:02    ASSESSMENT AND  PLAN:   Principal Problem:   Stroke (cerebrum) (HCC) Active Problems:   Acquired hypothyroidism   Essential (primary) hypertension   Glaucoma   Depression   Hypokalemia   Stroke (HCC)  * Headache- suspected stroke- ruled out.   Likely was due to Hypertension.   MRI, and carotid doppler are negative.  Give tylenol and fioricet as needed. Headache improved  * Essential hypertension   Under control now.    Stop HCTz on dc. May cont Losartan.   * Hypokalemia    Was taking Hyzaar- need to stop HCTZ on d/c.    Replaced.  *   Hypothyroidism   Cont replacement  * Glaucoma   Cont Eye drops.   All the records are reviewed and case discussed with Care Management/Social Workerr. Management plans discussed with the patient, family and they are in agreement.  CODE STATUS: DNR  TOTAL TIME TAKING CARE OF THIS PATIENT: 35 minutes.   SNF when bed available  POSSIBLE D/C IN 1-2 DAYS, DEPENDING ON CLINICAL CONDITION.   Milagros Loll R M.D on 01/08/2016   Between 7am to 6pm - Pager - [phone]  After 6pm go to [email or web address] - password [email or web address]  [facility]  Office  [phone]  CC: Primary care physician; Pcp Not In System  Note: This dictation was prepared with Dragon dictation along with smaller phrase technology. Any transcriptional errors that result from this process are unintentional.

## 2016-01-08 NOTE — Clinical Social Work Placement (Signed)
   CLINICAL SOCIAL WORK PLACEMENT  NOTE  Date:  01/08/2016  Patient Details  Name: Connie Love MRN: [id number] Date of Birth: 1922/08/19  Clinical Social Work is seeking post-discharge placement for this patient at the Skilled  Nursing Facility level of care (*CSW will initial, date and re-position this form in  chart as items are completed):  Yes   Patient/family provided with [facility] Clinical Social Work Department's list of facilities offering this level of care within the geographic area requested by the patient (or if unable, by the patient's family).  Yes   Patient/family informed of their freedom to choose among providers that offer the needed level of care, that participate in Medicare, Medicaid or managed care program needed by the patient, have an available bed and are willing to accept the patient.  Yes   Patient/family informed of [facility]'s ownership interest in [facility] and [facility], as well as of the fact that they are under no obligation to receive care at these facilities.  PASRR submitted to EDS on 01/08/16     PASRR number received on 01/08/16     Existing PASRR number confirmed on       FL2 transmitted to all facilities in geographic area requested by pt/family on 01/08/16     FL2 transmitted to all facilities within larger geographic area on       Patient informed that his/her managed care company has contracts with or will negotiate with certain facilities, including the following:        Yes   Patient/family informed of bed offers received.  Patient chooses bed at [facility]     Physician recommends and patient chooses bed at      Patient to be transferred to   on  .  Patient to be transferred to facility by       Patient family notified on   of transfer.  Name of family member notified:        PHYSICIAN       Additional Comment:    _______________________________________________ Dede Query, LCSW 01/08/2016, 3:13  PM

## 2016-01-08 NOTE — NC FL2 (Signed)
[place] MEDICAID FL2 LEVEL OF CARE SCREENING TOOL     IDENTIFICATION  Patient Name: Connie Love Birthdate: [birth year] Sex: female Admission Date (Current Location): 01/06/2016  [place] and [place] Number:  Chiropodist and Address:  [facility], [address] [place], [place] [address]      Provider Number: [id number]  Attending Physician Name and Address:  Milagros Loll, MD  Relative Name and Phone Number:       Current Level of Care: Hospital Recommended Level of Care: Skilled Nursing Facility Prior Approval Number:    Date Approved/Denied:   PASRR Number: [id number]  Discharge Plan: SNF    Current Diagnoses: Patient Active Problem List   Diagnosis Date Noted  . Stroke (cerebrum) (HCC) 01/07/2016  . Depression 01/07/2016  . Hypokalemia 01/07/2016  . Stroke (HCC) 01/07/2016  . Acquired hypothyroidism 09/18/2015  . Edema extremities 09/18/2015  . Essential (primary) hypertension 09/18/2015  . Glaucoma 09/18/2015    Orientation RESPIRATION BLADDER Height & Weight    Self, Time, Situation, Place  Normal Continent  (157.5 cm) 136 lbs.  BEHAVIORAL SYMPTOMS/MOOD NEUROLOGICAL BOWEL NUTRITION STATUS      Continent Diet (Regular Diet, Thin Liquids)  AMBULATORY STATUS COMMUNICATION OF NEEDS Skin   Extensive Assist Verbally Normal                       Personal Care Assistance Level of Assistance  Bathing, Feeding, Dressing Bathing Assistance: Limited assistance Feeding assistance: Limited assistance Dressing Assistance: Limited assistance     Functional Limitations Info  Sight, Hearing, Speech Sight Info: Impaired Hearing Info: Impaired Speech Info: Adequate    SPECIAL CARE FACTORS FREQUENCY  PT (By licensed PT), OT (By licensed OT)     PT Frequency: 5 OT Frequency: 5            Contractures      Additional Factors Info  Code Status, Allergies Code Status Info: Full Code Allergies Info: No  known allergies           Current Medications (01/08/2016):  This is the current hospital active medication list Current Facility-Administered Medications  Medication Dose Route Frequency Provider Last Rate Last Dose  . acetaminophen (TYLENOL) tablet 650 mg  650 mg Oral Q4H PRN Altamese Dilling, MD   650 mg at 01/07/16 1803  . aspirin tablet 325 mg  325 mg Oral Daily Altamese Dilling, MD   325 mg at 01/08/16 0924  . atorvastatin (LIPITOR) tablet 40 mg  40 mg Oral q1800 Altamese Dilling, MD   40 mg at 01/07/16 1804  . butalbital-acetaminophen-caffeine (FIORICET, ESGIC) 50-325-40 MG per tablet 1 tablet  1 tablet Oral Q6H PRN Altamese Dilling, MD   1 tablet at 01/08/16 0924  . dorzolamide-timolol (COSOPT) 22.3-6.8 MG/ML ophthalmic solution 1 drop  1 drop Both Eyes BID Oralia Manis, MD   1 drop at 01/08/16 1109  . enoxaparin (LOVENOX) injection 40 mg  40 mg Subcutaneous Q24H Oralia Manis, MD   40 mg at 01/07/16 0657  . levothyroxine (SYNTHROID, LEVOTHROID) tablet 75 mcg  75 mcg Oral QAC breakfast Oralia Manis, MD   75 mcg at 01/08/16 0925  . losartan (COZAAR) tablet 100 mg  100 mg Oral Daily Srikar Sudini, MD      . potassium chloride SA (K-DUR,KLOR-CON) CR tablet 20 mEq  20 mEq Oral BID Altamese Dilling, MD   20 mEq at 01/08/16 0924  . potassium chloride SA (K-DUR,KLOR-CON) CR tablet 40 mEq  40 mEq Oral Once Henry [employer]in, MD      . prednisoLONE acetate (PRED FORTE) 1 % ophthalmic suspension 1 drop  1 drop Both Eyes 6 times per day Oralia Manis, MD   1 drop at 01/08/16 1109  . sertraline (ZOLOFT) tablet 50 mg  50 mg Oral Daily Oralia Manis, MD   50 mg at 01/08/16 4098     Discharge Medications: Please see discharge summary for a list of discharge medications.  Relevant Imaging Results:  Relevant Lab Results:   Additional Information SSN:  [id number]  Dede Query, LCSW

## 2016-01-08 NOTE — Evaluation (Signed)
Physical Therapy Evaluation Patient Details Name: Connie Love MRN: [id number] DOB: [birth year] Today's Date: 01/08/2016   History of Present Illness  Pt admitted for possible CVA. Negative testing for CVA confirmed. Pt with history of depression, glucoma, hypokalemia, and HTN.   Clinical Impression  Pt is a pleasant 82 year old female who was admitted for complaints of headache.Negative MRI noted. Pt with periods of confusion over night and is very sleepy this AM. Pt agreeable to OOB mobility. Pt performs bed mobility, transfers, and ambulation with min assist and rw. Pt takes heavy verbal cues for sequencing. Pt demonstrates deficits with strength/balance/endurance/mobility. Would benefit from skilled PT to address above deficits and promote optimal return to PLOF; recommend transition to STR upon discharge from acute hospitalization.       Follow Up Recommendations SNF    Equipment Recommendations       Recommendations for Other Services       Precautions / Restrictions Precautions Precautions: None Restrictions Weight Bearing Restrictions: No      Mobility  Bed Mobility Overal bed mobility: Needs Assistance Bed Mobility: Supine to Sit     Supine to sit: Min assist     General bed mobility comments: assist for sequencing and scooting out towards EOB. Once seated at EOB, pt able to sit with cga  Transfers Overall transfer level: Needs assistance Equipment used: Rolling walker (2 wheeled) Transfers: Sit to/from Stand Sit to Stand: Min assist         General transfer comment: sit<>Stand with rw and cues for pushing from seated surface. Pt has difficulty following commands and tries to pull up on rw. Pt with high anxiety and fear of falling  Ambulation/Gait Ambulation/Gait assistance: Min assist Ambulation Distance (Feet): 3 Feet Assistive device: Rolling walker (2 wheeled) Gait Pattern/deviations: Step-to pattern     General Gait Details: ambulated with  forward flexed posture. Heavy cues for sequencing required including step to gait. Pt fatigues quickly with ambulation  Stairs            Wheelchair Mobility    Modified Rankin (Stroke Patients Only)       Balance Overall balance assessment: History of Falls;Needs assistance Sitting-balance support: Feet supported Sitting balance-Leahy Scale: Fair     Standing balance support: Bilateral upper extremity supported Standing balance-Leahy Scale: Fair                               Pertinent Vitals/Pain Pain Assessment: No/denies pain    Home Living Family/patient expects to be discharged to:: Private residence Living Arrangements: Children Available Help at Discharge: Family (daughter) Type of Home: House Home Access: Stairs to enter Entrance Stairs-Rails:  (B rails, however too far apart to reach at same time) Entrance Stairs-Number of Steps: 4 Home Layout: One level Home Equipment: Walker - standard;Cane - single point      Prior Function Level of Independence: Independent         Comments: grabs on to walls as she can't see very well, history of 3 falls in last year     Hand Dominance        Extremity/Trunk Assessment   Upper Extremity Assessment: Overall WFL for tasks assessed           Lower Extremity Assessment: Generalized weakness (grossly 3+/5)         Communication   Communication: No difficulties  Cognition Arousal/Alertness: Awake/alert Behavior During Therapy: WFL for tasks assessed/performed Overall  Cognitive Status: Impaired/Different from baseline                      General Comments      Exercises Other Exercises Other Exercises: seated therex performed including B LE LAQ and alt marching. Pt also performed UE chair push ups. All ther-ex performed x 10 reps with cga.      Assessment/Plan    PT Assessment Patient needs continued PT services  PT Diagnosis Difficulty walking;Generalized weakness    PT Problem List Decreased strength;Decreased balance;Decreased mobility  PT Treatment Interventions Gait training;Therapeutic activities;Therapeutic exercise   PT Goals (Current goals can be found in the Care Plan section) Acute Rehab PT Goals Patient Stated Goal: to go home PT Goal Formulation: With patient Time For Goal Achievement: 01/22/16 Potential to Achieve Goals: Good    Frequency Min 2X/week   Barriers to discharge        Co-evaluation               End of Session Equipment Utilized During Treatment: Gait belt Activity Tolerance: Patient tolerated treatment well Patient left: in chair;with chair alarm set Nurse Communication: Mobility status         Time: 9604-5409 PT Time Calculation (min) (ACUTE ONLY): 32 min   Charges:   PT Evaluation $PT Eval Moderate Complexity: 1 Procedure PT Treatments $Therapeutic Exercise: 8-22 mins   PT G Codes:        Wm Sahagun 01-15-2016, 11:51 AM  Elizabeth Palau, PT, DPT [phone]

## 2016-01-08 NOTE — Evaluation (Signed)
Occupational Therapy Evaluation Patient Details Name: Connie Love MRN: [id number] DOB: March 16, 1922 Today's Date: 01/08/2016    History of Present Illness Pt admitted for possible CVA. Negative testing for CVA confirmed. Pt with history of depression, glucoma, hypokalemia, and HTN.    Clinical Impression   This patient is a 82 year old female who came to [facility] ED with history of falls and for a possible CVA. MRI was negative for CVA. She lives with her daughter in a one level home with 4 steps to enter and rails on both sides, but can only reach one at time. She was dressing, toileting, and self feeding on her own with out assistive device. She took sink baths. She is hard of hearing and has poor eye sight.  She shows deficits in mobility and activities of daily living She now needs assist with her activities of daily living and would benefit from Occupational Therapy for ADL/functional mobility training.    Follow Up Recommendations  SNF    Equipment Recommendations       Recommendations for Other Services       Precautions / Restrictions Precautions Precautions: None Restrictions Weight Bearing Restrictions: No      Mobility Bed Mobility  Transfers            Balance Overall balance assessment: History of Falls;Needs assistance Sitting-balance support: Feet supported Sitting balance-Leahy Scale: Fair                                  ADL                                         General ADL Comments: Had been indpendent with basic ADL of dressing, bathing and toileting. She is able to get a sock off but not back on and will need at least minimal assist for dressing and toileting.      Vision     Perception     Praxis      Pertinent Vitals/Pain Pain Assessment: No/denies pain     Hand Dominance     Extremity/Trunk Assessment Upper Extremity Assessment Upper Extremity Assessment:  (B UE range  of motion WNL strength 3+/5, grip R 26 lbs, L 30 lbs ) sensation is normal for light touch, temp, and stereognosis. She is unable to see well enough to complete the 9 hole peg test.   Lower Extremity Assessment Lower Extremity Assessment: Defer to PT evaluation       Communication Communication Communication: No difficulties (hard of hearing.)   Cognition Arousal/Alertness: Awake/alert Behavior During Therapy: WFL for tasks assessed/performed Overall Cognitive Status: Impaired/Different from baseline                     General Comments       Exercises   Shoulder Instructions      Home Living Family/patient expects to be discharged to:: Skilled nursing facility Living Arrangements: Children (live with daughter) Available Help at Discharge: Family Type of Home: House Home Access: Stairs to enter Technical brewer of Steps: 4 Entrance Stairs-Rails:  (bilateral rails, can only reach one at a time.) Home Layout: One level               Home Equipment: Walker - standard;Cane - single point  Prior Functioning/Environment Level of Independence: Independent with dressing, bathing, toileting eating and taking a sink bath.       Comments: poor vision, uses walls to guider.    OT Diagnosis: Generalized weakness   OT Problem List: Decreased strength;Decreased activity tolerance;Impaired balance (sitting and/or standing);Impaired vision/perception (impaired vision and hearing.)   OT Treatment/Interventions: Self-care/ADL training;Therapeutic exercise;Energy conservation;DME and/or AE instruction;Patient/family education;Therapeutic activities    OT Goals(Current goals can be found in the care plan section) Acute Rehab OT Goals Patient Stated Goal: to go home OT Goal Formulation: With patient/family Time For Goal Achievement: 01/22/16 Potential to Achieve Goals: Good  OT Frequency: Min 1X/week   Barriers to D/C:            Co-evaluation               End of Session Equipment Utilized During Treatment:  (Stroke test kit)  Activity Tolerance:   Patient left: in bed;with call bell/phone within reach;with bed alarm set;with family/visitor present   Time: 1410-1430 OT Time Calculation (min): 20 min Charges:  OT General Charges $OT Visit: 1 Procedure OT Evaluation $OT Eval Low Complexity: 1 Procedure G-Codes:    Myrene Galas, MS/OTR/L  01/08/2016, 2:41 PM

## 2016-01-08 NOTE — Clinical Social Work Note (Signed)
Clinical Social Work Assessment  Patient Details  Name: Connie Love MRN: [id number] Date of Birth: Jun 25, 1922  Date of referral:  01/08/16               Reason for consult:  Facility Placement                Permission sought to share information with:   Family  Permission granted to share information::  Yes, Verbal Permission Granted  Name::     Connie Love, daughter   Housing/Transportation Living arrangements for the past 2 months:  Homeworth of Information:  Patient, Adult Children Patient Interpreter Needed:  None Criminal Activity/Legal Involvement Pertinent to Current Situation/Hospitalization:  No - Comment as needed Significant Relationships:  Adult Children Lives with:  Adult Children Do you feel safe going back to the place where you live?  Yes Need for family participation in patient care:  Yes (Comment)  Care giving concerns:  No care giving concerns identified.    Social Worker assessment / plan:  CSW met with pt and family to address consult for SNF. CSW introduced herself and explained role of social work. CSW also explained the process of discharging to SNF with traditional Medicare. Pt lives with daughter and has been primarily home bound. PT/OT are recommending SNF at discharge. Pt and family are agreeable to discharge plan. CSW initiated a bed search. Pt's family preference was [facility]. CSW provided offers and secured a bed at [facility] for [employer]. CSW will continue to follow.   Employment status:  Retired Forensic scientist:  Medicare PT Recommendations:  [place] / Referral to community resources:  [place]  Patient/Family's Response to care:  Pt and family were appreciative of CSW support.   Patient/Family's Understanding of and Emotional Response to Diagnosis, Current Treatment, and Prognosis:  Pt and family are aware of recommendations and feel that pt would benefit from STR at SNF prior to  returning home.   Emotional Assessment Appearance:  Appears stated age Attitude/Demeanor/Rapport:  Other (Appropriate) Affect (typically observed):  Accepting, Adaptable, Pleasant Orientation:  Oriented to Self, Oriented to Place, Oriented to  Time, Oriented to Situation Alcohol / Substance use:  Never Used Psych involvement (Current and /or in the community):  No (Comment)  Discharge Needs  Concerns to be addressed:  Adjustment to Illness Readmission within the last 30 days:  No Current discharge risk:  None Barriers to Discharge:  Continued Medical Work up   [employer], LCSW 01/08/2016, 4:12 PM

## 2016-01-09 LAB — URINALYSIS COMPLETE WITH MICROSCOPIC (ARMC ONLY)
BILIRUBIN URINE: NEGATIVE
GLUCOSE, UA: NEGATIVE mg/dL
NITRITE: NEGATIVE
Protein, ur: NEGATIVE mg/dL
Specific Gravity, Urine: 1.012 (ref 1.005–1.030)
pH: 6 (ref 5.0–8.0)

## 2016-01-09 MED ORDER — AMLODIPINE BESYLATE 5 MG PO TABS
5.0000 mg | ORAL_TABLET | Freq: Every day | ORAL | Status: DC
  Administered 2016-01-09 – 2016-01-11 (×3): 5 mg via ORAL
  Filled 2016-01-09 (×3): qty 1

## 2016-01-09 MED ORDER — QUETIAPINE FUMARATE 25 MG PO TABS
25.0000 mg | ORAL_TABLET | Freq: Every day | ORAL | Status: DC
  Administered 2016-01-09: 25 mg via ORAL
  Filled 2016-01-09: qty 1

## 2016-01-09 MED ORDER — SODIUM CHLORIDE 0.9 % IV SOLN
INTRAVENOUS | Status: DC
  Administered 2016-01-09: 17:00:00 via INTRAVENOUS

## 2016-01-09 NOTE — Progress Notes (Signed)
Dr Sudini-urinalysis, normal saline  /1076ml for 1 bag only

## 2016-01-09 NOTE — Progress Notes (Signed)
[facility] - [place] at [facility]   PATIENT NAME: Connie Love    MR#:  [id number]  DATE OF BIRTH:  1922/12/12  SUBJECTIVE:  CHIEF COMPLAINT:   Chief Complaint  Patient presents with  . Headache     Came with severe headache, no Stroke on MRI.     Headache resolved.   REVIEW OF SYSTEMS:    Review of Systems  Unable to perform ROS: dementia    DRUG ALLERGIES:  No Known Allergies  VITALS:  Blood pressure 180/97, pulse 116, temperature 97.7 F (36.5 C), temperature source Oral, resp. rate 20, height  (1.575 m), weight 61.916 kg (136 lb 8 oz), SpO2 95 %.  PHYSICAL EXAMINATION:  GENERAL:  82 y.o.-year-old patient lying in the bed with no acute distress.  EYES: Pupils equal, round, reactive to light and accommodation. No scleral icterus. Extraocular muscles intact.  HEENT: Head atraumatic, normocephalic. Oropharynx and nasopharynx clear. Hearing deficit.  NECK:  Supple, no jugular venous distention. No thyroid enlargement, no tenderness.  LUNGS: Normal breath sounds bilaterally, no wheezing, rales,rhonchi or crepitation. No use of accessory muscles of respiration.  CARDIOVASCULAR: S1, S2 normal. No murmurs, rubs, or gallops.  ABDOMEN: Soft, nontender, nondistended. Bowel sounds present. No organomegaly or mass.  EXTREMITIES: No pedal edema, cyanosis, or clubbing.  NEUROLOGIC: Cranial nerves II through XII are intact. Muscle strength 4/5 in all extremities. Sensation intact. Gait not checked.  PSYCHIATRIC: The patient is drowzy SKIN: No obvious rash, lesion, or ulcer.   Physical Exam LABORATORY PANEL:   CBC  Recent Labs Lab 01/07/16 0851  WBC 9.7  HGB 12.8  HCT 37.7  PLT 226   ------------------------------------------------------------------------------------------------------------------  Chemistries   Recent Labs Lab 01/07/16 0851 01/08/16 0851 01/08/16 0910  NA 128*  --  130*  K 3.3*  --  3.3*  CL 92*  --  95*  CO2 27  --   30  GLUCOSE 109*  --  106*  BUN 20  --  16  CREATININE 0.47  --  0.46  CALCIUM 8.7*  --  8.8*  MG  --  2.1  --   AST 22  --   --   ALT 15  --   --   ALKPHOS 65  --   --   BILITOT 1.2  --   --    ------------------------------------------------------------------------------------------------------------------  Cardiac Enzymes  Recent Labs Lab 01/07/16 0007  TROPONINI <0.03   ------------------------------------------------------------------------------------------------------------------  RADIOLOGY:  Ct Head Wo Contrast  01/06/2016  CLINICAL DATA:  Acute onset headache with nausea and photophobia EXAM: CT HEAD WITHOUT CONTRAST TECHNIQUE: Contiguous axial images were obtained from the base of the skull through the vertex without intravenous contrast. COMPARISON:  None. FINDINGS: There is moderate diffuse atrophy. There is no intracranial mass, hemorrhage, extra-axial fluid collection, or midline shift. There is patchy small vessel disease in the centra semiovale bilaterally. There is evidence of prior small infarcts in each globus pallidum region. There is an infarct involving the posterior limb of the left external capsule. There is decreased attenuation involving portions of the left claustrum and extreme capsule, best appreciated on axial slice 18 series 2. This decreased attenuation is consistent with an age uncertain infarct. No other findings indicative of potential acute infarct appreciated. The bony calvarium appears intact. The mastoid air cells are clear. Visualized orbital regions appear normal. IMPRESSION: Age uncertain small infarct involving the left claustrum and extreme capsule. Nearby prior infarct in the left lateral basal ganglia. Small  prior infarct in the inferior right posterior lentiform nucleus region. There is atrophy with moderate patchy periventricular small vessel disease. No hemorrhage or mass effect. No midline shift. Electronically Signed   By: Bretta Bang  III M.D.   On: 01/06/2016 21:10   Mr Brain Wo Contrast  01/07/2016  CLINICAL DATA:  Headaches.  Abnormal CT of the head. EXAM: MRI HEAD WITHOUT CONTRAST MRA HEAD WITHOUT CONTRAST TECHNIQUE: Multiplanar, multiecho pulse sequences of the brain and surrounding structures were obtained without intravenous contrast. Angiographic images of the head were obtained using MRA technique without contrast. COMPARISON:  CT head without contrast 01/05/2006. FINDINGS: MRI HEAD FINDINGS The diffusion-weighted images demonstrate no evidence for acute or subacute infarct. Dilated perivascular spaces and remote infarcts are evident in the inferior aspect of the lentiform nucleus and claustrum. Remote white matter changes are present in the left external capsule. Dilated perivascular spaces are present throughout both basal ganglia. Remote ischemic changes are present of ally. Moderate periventricular and subcortical white matter changes are present bilaterally. The internal auditory canals are within normal limits. Flow is present in the major intracranial arteries. Bilateral lens replacements are present. The globes and orbits are intact. Paranasal sinuses and mastoid air cells are clear. The skullbase is within normal limits. Midline sagittal images demonstrate mild atrophy of the corpus callosum. There is a relatively empty sella. No discrete lesions are evident. MRA HEAD FINDINGS An atherosclerotic irregularities are present within the cavernous internal carotid arteries bilaterally without a significant focal stenosis. Posterior communicating arteries are present bilaterally. Mild irregularity is present in the distal M1 segments. This is somewhat exaggerated by patient motion. There is moderate attenuation of distal MCA branch vessels bilaterally without a significant proximal stenosis or occlusion on the right. There is mild-to-moderate narrowing of the superior left M2 segment after an initial inferior and to take golf. The  right vertebral artery is the dominant vessel. There is moderate narrowing at the origin of the PICA vessels bilaterally. The posterior she  the the basilar artery is normal. There is mild narrowing of the proximal P1 segments bilaterally. Prominent posterior communicating arteries are present bilaterally. There is moderate signal loss in the distal P2 segments bilaterally. This is exaggerated by patient motion through this area. IMPRESSION: 1. No acute intracranial abnormality. Specifically, no evidence for acute left basal ganglia infarct. 2. Remote lacunar infarcts in dilated perivascular spaces are present throughout the basal ganglia bilaterally, particularly within the inferior left lentiform nucleus and external capsule. 3. Moderate diffuse periventricular and subcortical white matter changes bilaterally. These correspond with extensive distal media min small vessel disease on the MRA. 4. Moderate narrowing of the superior division proximal left M2 segment. 5. Moderate narrowing of the PICA origins bilaterally. 6. No other significant proximal stenosis, aneurysm, or branch vessel occlusion. Electronically Signed   By: Marin Roberts M.D.   On: 01/07/2016 13:02   US Carotid Bilateral  01/07/2016  CLINICAL DATA:  82 year old female with stroke-like symptoms and a severe headache EXAM: BILATERAL CAROTID DUPLEX ULTRASOUND TECHNIQUE: Wallace Cullens scale imaging, color Doppler and duplex ultrasound were performed of bilateral carotid and vertebral arteries in the neck. COMPARISON:  Brain MRI 01/07/16 FINDINGS: Criteria: Quantification of carotid stenosis is based on velocity parameters that correlate the residual internal carotid diameter with NASCET-based stenosis levels, using the diameter of the distal internal carotid lumen as the denominator for stenosis measurement. The following velocity measurements were obtained: RIGHT ICA:  63/16 cm/sec CCA:  72/11 cm/sec SYSTOLIC ICA/CCA RATIO:  0.9 DIASTOLIC ICA/CCA  RATIO:  1.9 ECA:  117 cm/sec LEFT ICA:  63/17 cm/sec CCA:  77/12 cm/sec SYSTOLIC ICA/CCA RATIO:  0.8 DIASTOLIC ICA/CCA RATIO:  1.7 ECA:  131 cm/sec RIGHT CAROTID ARTERY: Trace atherosclerotic plaque in the carotid bulb without evidence of stenosis. RIGHT VERTEBRAL ARTERY:  Patent with antegrade flow. LEFT CAROTID ARTERY: Trace atherosclerotic plaque in the carotid bulb without evidence of stenosis. LEFT VERTEBRAL ARTERY:  Patent with normal antegrade flow. IMPRESSION: 1. Trace atherosclerotic plaque in the carotid bifurcations bilaterally without evidence of stenosis. 2. Vertebral arteries are patent with antegrade flow. Signed, Sterling Big, MD [facility] Electronically Signed   By: Malachy Moan M.D.   On: 01/07/2016 14:30   Mr Maxine Glenn Head/brain Wo Cm  01/07/2016  CLINICAL DATA:  Headaches.  Abnormal CT of the head. EXAM: MRI HEAD WITHOUT CONTRAST MRA HEAD WITHOUT CONTRAST TECHNIQUE: Multiplanar, multiecho pulse sequences of the brain and surrounding structures were obtained without intravenous contrast. Angiographic images of the head were obtained using MRA technique without contrast. COMPARISON:  CT head without contrast 01/05/2006. FINDINGS: MRI HEAD FINDINGS The diffusion-weighted images demonstrate no evidence for acute or subacute infarct. Dilated perivascular spaces and remote infarcts are evident in the inferior aspect of the lentiform nucleus and claustrum. Remote white matter changes are present in the left external capsule. Dilated perivascular spaces are present throughout both basal ganglia. Remote ischemic changes are present of ally. Moderate periventricular and subcortical white matter changes are present bilaterally. The internal auditory canals are within normal limits. Flow is present in the major intracranial arteries. Bilateral lens replacements are present. The globes and orbits are intact. Paranasal sinuses and mastoid  air cells are clear. The skullbase is within normal limits. Midline sagittal images demonstrate mild atrophy of the corpus callosum. There is a relatively empty sella. No discrete lesions are evident. MRA HEAD FINDINGS An atherosclerotic irregularities are present within the cavernous internal carotid arteries bilaterally without a significant focal stenosis. Posterior communicating arteries are present bilaterally. Mild irregularity is present in the distal M1 segments. This is somewhat exaggerated by patient motion. There is moderate attenuation of distal MCA branch vessels bilaterally without a significant proximal stenosis or occlusion on the right. There is mild-to-moderate narrowing of the superior left M2 segment after an initial inferior and to take golf. The right vertebral artery is the dominant vessel. There is moderate narrowing at the origin of the PICA vessels bilaterally. The posterior she  the the basilar artery is normal. There is mild narrowing of the proximal P1 segments bilaterally. Prominent posterior communicating arteries are present bilaterally. There is moderate signal loss in the distal P2 segments bilaterally. This is exaggerated by patient motion through this area. IMPRESSION: 1. No acute intracranial abnormality. Specifically, no evidence for acute left basal ganglia infarct. 2. Remote lacunar infarcts in dilated perivascular spaces are present throughout the basal ganglia bilaterally, particularly within the inferior left lentiform nucleus and external capsule. 3. Moderate diffuse periventricular and subcortical white matter changes bilaterally. These correspond with extensive distal media min small vessel disease on the MRA. 4. Moderate narrowing of the superior division proximal left M2 segment. 5. Moderate narrowing of the PICA origins bilaterally. 6. No other significant proximal stenosis, aneurysm, or branch vessel occlusion. Electronically Signed   By: Marin Roberts M.D.    On: 01/07/2016 13:02    ASSESSMENT AND PLAN:   Principal Problem:   Stroke (cerebrum) (HCC) Active Problems:   Acquired hypothyroidism  Essential (primary) hypertension   Glaucoma   Depression   Hypokalemia   Stroke (HCC)  * Headache- suspected stroke- ruled out.   Likely was due to Hypertension.   MRI, and carotid doppler are negative.   Give tylenol and fioricet as needed. Headache improved  * Essential hypertension   Under control now.    Stop HCTz on dc. May cont Losartan.   * Hypokalemia    Was taking Hyzaar- need to stop HCTZ on d/c.    Replaced.  *   Hypothyroidism   Cont replacement  * Glaucoma   Cont Eye drops.  * Inpatient delirium over dementia Start QHS seroquel.   All the records are reviewed and case discussed with Care Management/Social Workerr. Management plans discussed with the patient, family and they are in agreement.  CODE STATUS: DNR  TOTAL TIME TAKING CARE OF THIS PATIENT: 35 minutes.   SNF when bed available  POSSIBLE D/C IN 1-2 DAYS, DEPENDING ON CLINICAL CONDITION.   Milagros Loll R M.D on 01/09/2016   Between 7am to 6pm - Pager - [phone]  After 6pm go to [email or web address] - password [email or web address]  [facility]  Office  [phone]  CC: Primary care physician; Pcp Not In System  Note: This dictation was prepared with Dragon dictation along with smaller phrase technology. Any transcriptional errors that result from this process are unintentional.

## 2016-01-10 LAB — CBC
HEMATOCRIT: 40.4 % (ref 35.0–47.0)
HEMOGLOBIN: 13.7 g/dL (ref 12.0–16.0)
MCH: 31.8 pg (ref 26.0–34.0)
MCHC: 33.9 g/dL (ref 32.0–36.0)
MCV: 93.7 fL (ref 80.0–100.0)
Platelets: 227 10*3/uL (ref 150–440)
RBC: 4.31 MIL/uL (ref 3.80–5.20)
RDW: 13.6 % (ref 11.5–14.5)
WBC: 13 10*3/uL — ABNORMAL HIGH (ref 3.6–11.0)

## 2016-01-10 MED ORDER — DEXTROSE 5 % IV SOLN
1.0000 g | INTRAVENOUS | Status: DC
  Filled 2016-01-10: qty 10

## 2016-01-10 MED ORDER — DEXTROSE 5 % IV SOLN
1.0000 g | Freq: Once | INTRAVENOUS | Status: AC
  Administered 2016-01-10: 1 g via INTRAVENOUS
  Filled 2016-01-10: qty 10

## 2016-01-10 NOTE — Progress Notes (Signed)
[facility] - [place] at [facility]   PATIENT NAME: Connie Love    MR#:  [id number]  DATE OF BIRTH:  02-14-1922  SUBJECTIVE:  CHIEF COMPLAINT:   Chief Complaint  Patient presents with  . Headache     Came with severe headache, no Stroke on MRI.    Headache resolved.    Up in a chair.  Confused overnight.   REVIEW OF SYSTEMS:    Review of Systems  Unable to perform ROS: dementia    DRUG ALLERGIES:  No Known Allergies  VITALS:  Blood pressure 142/80, pulse 100, temperature 97.5 F (36.4 C), temperature source Oral, resp. rate 20, height  (1.575 m), weight 61.916 kg (136 lb 8 oz), SpO2 96 %.  PHYSICAL EXAMINATION:  GENERAL:  82 y.o.-year-old patient lying in the bed with no acute distress.  EYES: . No scleral icterus. Extraocular muscles intact.  HEENT: Head atraumatic, normocephalic. Oropharynx and nasopharynx clear. Hearing deficit.  NECK:  Supple, no jugular venous distention. No thyroid enlargement, no tenderness.  LUNGS: Normal breath sounds bilaterally, no wheezing, rales,rhonchi or crepitation. No use of accessory muscles of respiration.  CARDIOVASCULAR: S1, S2 normal. No murmurs, rubs, or gallops.  ABDOMEN: Soft, nontender, nondistended. Bowel sounds present. No organomegaly or mass.  EXTREMITIES: No pedal edema, cyanosis, or clubbing.  NEUROLOGIC: Cranial nerves II through XII are intact. Muscle strength 4/5 in all extremities. Sensation intact. Gait not checked.  PSYCHIATRIC: The patient is awake and confused. SKIN: No obvious rash, lesion, or ulcer.   Physical Exam LABORATORY PANEL:   CBC  Recent Labs Lab 01/10/16 0510  WBC 13.0*  HGB 13.7  HCT 40.4  PLT 227   ------------------------------------------------------------------------------------------------------------------  Chemistries   Recent Labs Lab 01/07/16 0851 01/08/16 0851 01/08/16 0910  NA 128*  --  130*  K 3.3*  --  3.3*  CL 92*  --  95*  CO2 27  --   30  GLUCOSE 109*  --  106*  BUN 20  --  16  CREATININE 0.47  --  0.46  CALCIUM 8.7*  --  8.8*  MG  --  2.1  --   AST 22  --   --   ALT 15  --   --   ALKPHOS 65  --   --   BILITOT 1.2  --   --    ------------------------------------------------------------------------------------------------------------------  Cardiac Enzymes  Recent Labs Lab 01/07/16 0007  TROPONINI <0.03   ------------------------------------------------------------------------------------------------------------------  RADIOLOGY:  Ct Head Wo Contrast  01/06/2016  CLINICAL DATA:  Acute onset headache with nausea and photophobia EXAM: CT HEAD WITHOUT CONTRAST TECHNIQUE: Contiguous axial images were obtained from the base of the skull through the vertex without intravenous contrast. COMPARISON:  None. FINDINGS: There is moderate diffuse atrophy. There is no intracranial mass, hemorrhage, extra-axial fluid collection, or midline shift. There is patchy small vessel disease in the centra semiovale bilaterally. There is evidence of prior small infarcts in each globus pallidum region. There is an infarct involving the posterior limb of the left external capsule. There is decreased attenuation involving portions of the left claustrum and extreme capsule, best appreciated on axial slice 18 series 2. This decreased attenuation is consistent with an age uncertain infarct. No other findings indicative of potential acute infarct appreciated. The bony calvarium appears intact. The mastoid air cells are clear. Visualized orbital regions appear normal. IMPRESSION: Age uncertain small infarct involving the left claustrum and extreme capsule. Nearby prior infarct in the left  lateral basal ganglia. Small prior infarct in the inferior right posterior lentiform nucleus region. There is atrophy with moderate patchy periventricular small vessel disease. No hemorrhage or mass effect. No midline shift. Electronically Signed   By: Bretta Bang  III M.D.   On: 01/06/2016 21:10   Mr Brain Wo Contrast  01/07/2016  CLINICAL DATA:  Headaches.  Abnormal CT of the head. EXAM: MRI HEAD WITHOUT CONTRAST MRA HEAD WITHOUT CONTRAST TECHNIQUE: Multiplanar, multiecho pulse sequences of the brain and surrounding structures were obtained without intravenous contrast. Angiographic images of the head were obtained using MRA technique without contrast. COMPARISON:  CT head without contrast 01/05/2006. FINDINGS: MRI HEAD FINDINGS The diffusion-weighted images demonstrate no evidence for acute or subacute infarct. Dilated perivascular spaces and remote infarcts are evident in the inferior aspect of the lentiform nucleus and claustrum. Remote white matter changes are present in the left external capsule. Dilated perivascular spaces are present throughout both basal ganglia. Remote ischemic changes are present of ally. Moderate periventricular and subcortical white matter changes are present bilaterally. The internal auditory canals are within normal limits. Flow is present in the major intracranial arteries. Bilateral lens replacements are present. The globes and orbits are intact. Paranasal sinuses and mastoid air cells are clear. The skullbase is within normal limits. Midline sagittal images demonstrate mild atrophy of the corpus callosum. There is a relatively empty sella. No discrete lesions are evident. MRA HEAD FINDINGS An atherosclerotic irregularities are present within the cavernous internal carotid arteries bilaterally without a significant focal stenosis. Posterior communicating arteries are present bilaterally. Mild irregularity is present in the distal M1 segments. This is somewhat exaggerated by patient motion. There is moderate attenuation of distal MCA branch vessels bilaterally without a significant proximal stenosis or occlusion on the right. There is mild-to-moderate narrowing of the superior left M2 segment after an initial inferior and to take golf. The  right vertebral artery is the dominant vessel. There is moderate narrowing at the origin of the PICA vessels bilaterally. The posterior she  the the basilar artery is normal. There is mild narrowing of the proximal P1 segments bilaterally. Prominent posterior communicating arteries are present bilaterally. There is moderate signal loss in the distal P2 segments bilaterally. This is exaggerated by patient motion through this area. IMPRESSION: 1. No acute intracranial abnormality. Specifically, no evidence for acute left basal ganglia infarct. 2. Remote lacunar infarcts in dilated perivascular spaces are present throughout the basal ganglia bilaterally, particularly within the inferior left lentiform nucleus and external capsule. 3. Moderate diffuse periventricular and subcortical white matter changes bilaterally. These correspond with extensive distal media min small vessel disease on the MRA. 4. Moderate narrowing of the superior division proximal left M2 segment. 5. Moderate narrowing of the PICA origins bilaterally. 6. No other significant proximal stenosis, aneurysm, or branch vessel occlusion. Electronically Signed   By: Marin Roberts M.D.   On: 01/07/2016 13:02   US Carotid Bilateral  01/07/2016  CLINICAL DATA:  82 year old female with stroke-like symptoms and a severe headache EXAM: BILATERAL CAROTID DUPLEX ULTRASOUND TECHNIQUE: Wallace Cullens scale imaging, color Doppler and duplex ultrasound were performed of bilateral carotid and vertebral arteries in the neck. COMPARISON:  Brain MRI 01/07/16 FINDINGS: Criteria: Quantification of carotid stenosis is based on velocity parameters that correlate the residual internal carotid diameter with NASCET-based stenosis levels, using the diameter of the distal internal carotid lumen as the denominator for stenosis measurement. The following velocity measurements were obtained: RIGHT ICA:  63/16 cm/sec CCA:  72/11 cm/sec  SYSTOLIC ICA/CCA RATIO:  0.9 DIASTOLIC ICA/CCA  RATIO:  1.9 ECA:  117 cm/sec LEFT ICA:  63/17 cm/sec CCA:  77/12 cm/sec SYSTOLIC ICA/CCA RATIO:  0.8 DIASTOLIC ICA/CCA RATIO:  1.7 ECA:  131 cm/sec RIGHT CAROTID ARTERY: Trace atherosclerotic plaque in the carotid bulb without evidence of stenosis. RIGHT VERTEBRAL ARTERY:  Patent with antegrade flow. LEFT CAROTID ARTERY: Trace atherosclerotic plaque in the carotid bulb without evidence of stenosis. LEFT VERTEBRAL ARTERY:  Patent with normal antegrade flow. IMPRESSION: 1. Trace atherosclerotic plaque in the carotid bifurcations bilaterally without evidence of stenosis. 2. Vertebral arteries are patent with antegrade flow. Signed, Sterling Big, MD [facility] Electronically Signed   By: Malachy Moan M.D.   On: 01/07/2016 14:30   Mr Maxine Glenn Head/brain Wo Cm  01/07/2016  CLINICAL DATA:  Headaches.  Abnormal CT of the head. EXAM: MRI HEAD WITHOUT CONTRAST MRA HEAD WITHOUT CONTRAST TECHNIQUE: Multiplanar, multiecho pulse sequences of the brain and surrounding structures were obtained without intravenous contrast. Angiographic images of the head were obtained using MRA technique without contrast. COMPARISON:  CT head without contrast 01/05/2006. FINDINGS: MRI HEAD FINDINGS The diffusion-weighted images demonstrate no evidence for acute or subacute infarct. Dilated perivascular spaces and remote infarcts are evident in the inferior aspect of the lentiform nucleus and claustrum. Remote white matter changes are present in the left external capsule. Dilated perivascular spaces are present throughout both basal ganglia. Remote ischemic changes are present of ally. Moderate periventricular and subcortical white matter changes are present bilaterally. The internal auditory canals are within normal limits. Flow is present in the major intracranial arteries. Bilateral lens replacements are present. The globes and orbits are intact. Paranasal sinuses and mastoid  air cells are clear. The skullbase is within normal limits. Midline sagittal images demonstrate mild atrophy of the corpus callosum. There is a relatively empty sella. No discrete lesions are evident. MRA HEAD FINDINGS An atherosclerotic irregularities are present within the cavernous internal carotid arteries bilaterally without a significant focal stenosis. Posterior communicating arteries are present bilaterally. Mild irregularity is present in the distal M1 segments. This is somewhat exaggerated by patient motion. There is moderate attenuation of distal MCA branch vessels bilaterally without a significant proximal stenosis or occlusion on the right. There is mild-to-moderate narrowing of the superior left M2 segment after an initial inferior and to take golf. The right vertebral artery is the dominant vessel. There is moderate narrowing at the origin of the PICA vessels bilaterally. The posterior she  the the basilar artery is normal. There is mild narrowing of the proximal P1 segments bilaterally. Prominent posterior communicating arteries are present bilaterally. There is moderate signal loss in the distal P2 segments bilaterally. This is exaggerated by patient motion through this area. IMPRESSION: 1. No acute intracranial abnormality. Specifically, no evidence for acute left basal ganglia infarct. 2. Remote lacunar infarcts in dilated perivascular spaces are present throughout the basal ganglia bilaterally, particularly within the inferior left lentiform nucleus and external capsule. 3. Moderate diffuse periventricular and subcortical white matter changes bilaterally. These correspond with extensive distal media min small vessel disease on the MRA. 4. Moderate narrowing of the superior division proximal left M2 segment. 5. Moderate narrowing of the PICA origins bilaterally. 6. No other significant proximal stenosis, aneurysm, or branch vessel occlusion. Electronically Signed   By: Marin Roberts M.D.    On: 01/07/2016 13:02    ASSESSMENT AND PLAN:   Principal Problem:   Stroke (cerebrum) (HCC) Active Problems:  Acquired hypothyroidism   Essential (primary) hypertension   Glaucoma   Depression   Hypokalemia   Stroke (HCC)  * UTI Start ceftriaxone Cx pending  * Headache- suspected stroke- ruled out.   Likely was due to Hypertension.   MRI, and carotid doppler are negative.   Give tylenol and fioricet as needed. Headache improved  * Essential hypertension   Under control now.    Stop HCTZ on dc. May cont Losartan. Added Norvasc.   * Hypokalemia    Was taking Hyzaar- need to stop HCTZ on d/c.    Replaced.  *   Hypothyroidism   Cont replacement  * Glaucoma   Cont Eye drops.  * Inpatient delirium over dementia   All the records are reviewed and case discussed with Care Management/Social Workerr. Management plans discussed with the patient, family and they are in agreement.  CODE STATUS: DNR  TOTAL TIME TAKING CARE OF THIS PATIENT: 35 minutes.   SNF when bed available. Likely tomorrow  Milagros Loll R M.D on 01/10/2016   Between 7am to 6pm - Pager - [phone]  After 6pm go to [email or web address] - password [email or web address]  [facility]  Office  [phone]  CC: Primary care physician; Pcp Not In System  Note: This dictation was prepared with Dragon dictation along with smaller phrase technology. Any transcriptional errors that result from this process are unintentional.

## 2016-01-10 NOTE — Progress Notes (Signed)
Up to chair with one person assist. Patient encouraged to drink water and communicate with daughter at bedside. Bo Mcclintock, RN

## 2016-01-11 DIAGNOSIS — N390 Urinary tract infection, site not specified: Secondary | ICD-10-CM | POA: Diagnosis present

## 2016-01-11 DIAGNOSIS — E860 Dehydration: Secondary | ICD-10-CM | POA: Diagnosis present

## 2016-01-11 LAB — CREATININE, SERUM
CREATININE: 0.51 mg/dL (ref 0.44–1.00)
GFR calc Af Amer: >60 mL/min (ref >60)

## 2016-01-11 MED ORDER — AMLODIPINE BESYLATE 2.5 MG PO TABS: 2.5000 mg | ORAL_TABLET | Freq: Every day | ORAL | Status: AC

## 2016-01-11 MED ORDER — CIPROFLOXACIN HCL 250 MG PO TABS: 250.0000 mg | ORAL_TABLET | Freq: Two times a day (BID) | ORAL | Status: AC

## 2016-01-11 MED ORDER — LOSARTAN POTASSIUM 100 MG PO TABS: 100.0000 mg | ORAL_TABLET | Freq: Every day | ORAL | Status: AC

## 2016-01-11 NOTE — Discharge Summary (Signed)
[facility] Physicians - [place] at [facility]   PATIENT NAME: Connie Love    MR#:  [id number]  DATE OF BIRTH:  04-09-1922  DATE OF ADMISSION:  01/06/2016 ADMITTING PHYSICIAN: Oralia Manis, MD  DATE OF DISCHARGE: No discharge date for patient encounter.  PRIMARY CARE PHYSICIAN: Pcp Not In System    ADMISSION DIAGNOSIS:  Cerebral infarction due to unspecified mechanism [I63.9]  DISCHARGE DIAGNOSIS:  Active Problems:   Acquired hypothyroidism   Essential (primary) hypertension   Glaucoma   Depression   Hypokalemia   Dehydration   UTI (urinary tract infection)   SECONDARY DIAGNOSIS:   Past Medical History  Diagnosis Date  . Hypertension   . Thyroid disease   . Glaucoma   . Depression      ADMITTING HISTORY  Connie Love is a 82 y.o. female who presents with headache. Patient is unable to give much history, but she came to the ED for very persistent headache. This headache is frontal, and has not been alleviated by over-the-counter medications at home, or initial analgesics here in the ED. Workup in the ED showed old strokes and a suspected new, though read radiographically is age indeterminate stroke. Patient is unable to give much more history, though she denies any focal weakness or sensory deficits. Hospitalists were called for evaluation and workup for stroke.   HOSPITAL COURSE:   * UTI Started ceftriaxone Ciprofloxacin for 2 more days after discharge.  * Headache- suspected stroke- ruled out.  Likely was due to Hypertension.  MRI, and carotid doppler are negative.  Give tylenol and fioricet as needed. Headache improved Seen by neurology  * Essential hypertension  Under control now.  Stop HCTZ on dc. May cont Losartan. Added Norvasc.  * Hypokalemia  Resolved  * Hypothyroidism  Cont replacement  * Glaucoma  Cont Eye drops.  * Inpatient delirium over dementia Resolved  Stable for discharge SNF   CONSULTS  OBTAINED:  Treatment Team:  Thana Farr, MD  DRUG ALLERGIES:  No Known Allergies  DISCHARGE MEDICATIONS:   Current Discharge Medication List    START taking these medications   Details  amLODipine (NORVASC) 2.5 MG tablet Take 1 tablet (2.5 mg total) by mouth daily.    ciprofloxacin (CIPRO) 250 MG tablet Take 1 tablet (250 mg total) by mouth 2 (two) times daily. Qty: 4 tablet, Refills: 0    losartan (COZAAR) 100 MG tablet Take 1 tablet (100 mg total) by mouth daily.      CONTINUE these medications which have NOT CHANGED   Details  dorzolamide-timolol (COSOPT) 22.3-6.8 MG/ML ophthalmic solution 1 drop 2 (two) times daily.    levothyroxine (SYNTHROID, LEVOTHROID) 75 MCG tablet TAKE 1 TABLET BY MOUTH DAILY Qty: 30 tablet, Refills: 0    prednisoLONE acetate (PRED FORTE) 1 % ophthalmic suspension Apply to eye.    sertraline (ZOLOFT) 50 MG tablet Take 50 mg by mouth daily.      STOP taking these medications     losartan-hydrochlorothiazide (HYZAAR) 100-12.5 MG tablet          Today    VITAL SIGNS:  Blood pressure 115/75, pulse 119, temperature 97.4 F (36.3 C), temperature source Oral, resp. rate 18, height  (1.575 m), weight 65.091 kg (143 lb 8 oz), SpO2 95 %.  I/O:   Intake/Output Summary (Last 24 hours) at 01/11/16 0955 Last data filed at 01/11/16 0800  Gross per 24 hour  Intake      0 ml  Output  750 ml  Net   -750 ml    PHYSICAL EXAMINATION:  Physical Exam  GENERAL:  82 y.o.-year-old patient lying in the bed with no acute distress.  PSYCHIATRIC: The patient is sleeping SKIN: No obvious rash, lesion, or ulcer.   DATA REVIEW:   CBC  Recent Labs Lab 01/10/16 0510  WBC 13.0*  HGB 13.7  HCT 40.4  PLT 227    Chemistries   Recent Labs Lab 01/07/16 0851 01/08/16 0851 01/08/16 0910 01/11/16 0441  NA 128*  --  130*  --   K 3.3*  --  3.3*  --   CL 92*  --  95*  --   CO2 27  --  30  --   GLUCOSE 109*  --  106*  --   BUN 20  --   16  --   CREATININE 0.47  --  0.46 0.51  CALCIUM 8.7*  --  8.8*  --   MG  --  2.1  --   --   AST 22  --   --   --   ALT 15  --   --   --   ALKPHOS 65  --   --   --   BILITOT 1.2  --   --   --     Cardiac Enzymes  Recent Labs Lab 01/07/16 0007  TROPONINI <0.03    Microbiology Results  No results found for this or any previous visit.  RADIOLOGY:  No results found.    Follow up with PCP in 1 week.  Management plans discussed with the patient, family and they are in agreement.  CODE STATUS:     Code Status Orders        Start     Ordered   01/07/16 0631  Do not attempt resuscitation (DNR)   Continuous    Question Answer Comment  In the event of cardiac or respiratory ARREST Do not call a "code blue"   In the event of cardiac or respiratory ARREST Do not perform Intubation, CPR, defibrillation or ACLS   In the event of cardiac or respiratory ARREST Use medication by any route, position, wound care, and other measures to relive pain and suffering. May use oxygen, suction and manual treatment of airway obstruction as needed for comfort.      01/07/16 0630    Code Status History    Date Active Date Inactive Code Status Order ID Comments User Context   This patient has a current code status but no historical code status.    Advance Directive Documentation        Most Recent Value   Type of Advance Directive  Out of facility DNR (pink MOST or yellow form)   Pre-existing out of facility DNR order (yellow form or pink MOST form)  Pink MOST form placed in chart (order not valid for inpatient use)   "MOST" Form in Place?        TOTAL TIME TAKING CARE OF THIS PATIENT ON DAY OF DISCHARGE: more than 30 minutes.    Milagros Loll R M.D on 01/11/2016 at 9:55 AM  Between 7am to 6pm - Pager - [phone]  After 6pm go to [email or web address] - password [email or web address]  [facility]  Office  [phone]  CC: Primary care physician; Pcp Not In System     Note:  This dictation was prepared with Dragon dictation along with smaller phrase technology. Any transcriptional errors that result from this process are  unintentional.

## 2016-01-11 NOTE — Care Management Important Message (Signed)
Important Message  Patient Details  Name: Connie Love MRN: [id number] Date of Birth: 08-27-1922   Medicare Important Message Given:  Yes    Olegario Messier A Alayla Dethlefs 01/11/2016, 10:27 AM

## 2016-01-11 NOTE — Progress Notes (Signed)
Report called to Deanna at [facility].

## 2016-01-11 NOTE — Discharge Instructions (Signed)
°  DIET:  °Regular diet ° °DISCHARGE CONDITION:  °Stable ° °ACTIVITY:  °Activity as tolerated ° °OXYGEN:  °Home Oxygen: No. °  °Oxygen Delivery: room air ° °DISCHARGE LOCATION:  °nursing home  ° °If you experience worsening of your admission symptoms, develop shortness of breath, life threatening emergency, suicidal or homicidal thoughts you must seek medical attention immediately by calling 911 or calling your MD immediately  if symptoms less severe. ° °You Must read complete instructions/literature along with all the possible adverse reactions/side effects for all the Medicines you take and that have been prescribed to you. Take any new Medicines after you have completely understood and accpet all the possible adverse reactions/side effects.  ° °Please note ° °You were cared for by a hospitalist during your hospital stay. If you have any questions about your discharge medications or the care you received while you were in the hospital after you are discharged, you can call the unit and asked to speak with the hospitalist on call if the hospitalist that took care of you is not available. Once you are discharged, your primary care physician will handle any further medical issues. Please note that NO REFILLS for any discharge medications will be authorized once you are discharged, as it is imperative that you return to your primary care physician (or establish a relationship with a primary care physician if you do not have one) for your aftercare needs so that they can reassess your need for medications and monitor your lab values. ° ° ° °

## 2016-01-11 NOTE — Clinical Social Work Note (Signed)
Pt is ready for discharge today to [facility]. Pt and family are agreeable to discharge plan. Facility has received discharge information and is ready to admit pt. RN called report. EMS will provide transportation. CSW is signing off as no further needs identified.   Dede Query, MSW, LCSW  Clinical Social Worker [phone]

## 2016-01-12 DIAGNOSIS — I10 Essential (primary) hypertension: Secondary | ICD-10-CM | POA: Diagnosis not present

## 2016-01-12 DIAGNOSIS — I69398 Other sequelae of cerebral infarction: Secondary | ICD-10-CM

## 2016-01-12 DIAGNOSIS — R410 Disorientation, unspecified: Secondary | ICD-10-CM

## 2016-01-12 DIAGNOSIS — F39 Unspecified mood [affective] disorder: Secondary | ICD-10-CM

## 2016-01-29 DIAGNOSIS — H409 Unspecified glaucoma: Secondary | ICD-10-CM | POA: Diagnosis not present

## 2016-01-29 DIAGNOSIS — I69398 Other sequelae of cerebral infarction: Secondary | ICD-10-CM | POA: Diagnosis not present

## 2016-02-20 ENCOUNTER — Emergency Department
Admission: EM | Admit: 2016-02-20 | Discharge: 2016-02-20 | Disposition: A | Discharge status: Home / Self Care | Payer: Medicare Other | Attending: Emergency Medicine | Admitting: Emergency Medicine

## 2016-02-20 ENCOUNTER — Encounter: Payer: Self-pay | Admitting: Emergency Medicine

## 2016-02-20 ENCOUNTER — Emergency Department: Payer: Medicare Other

## 2016-02-20 DIAGNOSIS — H21562 Pupillary abnormality, left eye: Secondary | ICD-10-CM | POA: Insufficient documentation

## 2016-02-20 DIAGNOSIS — R51 Headache: Secondary | ICD-10-CM | POA: Diagnosis not present

## 2016-02-20 DIAGNOSIS — I10 Essential (primary) hypertension: Secondary | ICD-10-CM | POA: Diagnosis not present

## 2016-02-20 DIAGNOSIS — R519 Headache, unspecified: Secondary | ICD-10-CM

## 2016-02-20 HISTORY — DX: Transient cerebral ischemic attack, unspecified: G45.9

## 2016-02-20 MED ORDER — ACETAMINOPHEN 325 MG PO TABS
ORAL_TABLET | ORAL | Status: AC
  Administered 2016-02-20: 650 mg via ORAL
  Filled 2016-02-20: qty 2

## 2016-02-20 MED ORDER — ACETAMINOPHEN 325 MG PO TABS
650.0000 mg | ORAL_TABLET | Freq: Once | ORAL | Status: AC
  Administered 2016-02-20: 650 mg via ORAL

## 2016-02-20 MED ORDER — BUTALBITAL-APAP-CAFFEINE 50-325-40 MG PO TABS
1.0000 | ORAL_TABLET | Freq: Four times a day (QID) | ORAL | Status: AC | PRN
Start: 2016-02-20 — End: 2017-02-19

## 2016-02-20 NOTE — Discharge Instructions (Signed)

## 2016-02-20 NOTE — ED Provider Notes (Signed)
[facility] Emergency Department Provider Note     Time seen: ----------------------------------------- 10:05 PM on 02/20/2016 -----------------------------------------    I have reviewed the triage vital signs and the nursing notes.   HISTORY  Chief Complaint Headache    HPI Connie Love is a 82 y.o. female who presents to ER for severe headache last night. Per daughter patient's had a history of TIAs that began with a bad headache. She denies any numbness tingling or weakness, pain is a top for head, Tylenol has not helped it.   Past Medical History  Diagnosis Date  . Hypertension   . Thyroid disease   . Glaucoma   . Depression   . TIA (transient ischemic attack)     Patient Active Problem List   Diagnosis Date Noted  . Dehydration 01/11/2016  . UTI (urinary tract infection) 01/11/2016  . Depression 01/07/2016  . Hypokalemia 01/07/2016  . Acquired hypothyroidism 09/18/2015  . Edema extremities 09/18/2015  . Essential (primary) hypertension 09/18/2015  . Glaucoma 09/18/2015    Past Surgical History  Procedure Laterality Date  . Bladder tack    . Appendectomy    . Eye surgery      Allergies Review of patient's allergies indicates no known allergies.  Social History Social History  Substance Use Topics  . Smoking status: Never Smoker   . Smokeless tobacco: None  . Alcohol Use: No    Review of Systems Constitutional: Negative for fever. Eyes: Negative for visual changes. ENT: Negative for sore throat. Cardiovascular: Negative for chest pain. Respiratory: Negative for shortness of breath. Gastrointestinal: Negative for abdominal pain, vomiting and diarrhea. Genitourinary: Negative for dysuria. Musculoskeletal: Negative for back pain. Skin: Negative for rash. Neurological: Positive for headache  10-point ROS otherwise negative.  ____________________________________________   PHYSICAL EXAM:  VITAL SIGNS: ED  Triage Vitals  Enc Vitals Group     BP 02/20/16 1846 169/86 mmHg     Pulse Rate 02/20/16 1846 74     Resp 02/20/16 1846 18     Temp 02/20/16 1846 98.4 F (36.9 C)     Temp Source 02/20/16 2122 Oral     SpO2 02/20/16 1846 100 %     Weight 02/20/16 1846 126 lb (57.153 kg)     Height 02/20/16 1846  (1.575 m)     Head Cir --      Peak Flow --      Pain Score 02/20/16 1847 6     Pain Loc --      Pain Edu? --      Excl. in GC? --     Constitutional: Alert and oriented. Well appearing and in no distress. Eyes: Conjunctivae are normal. Chronic pupil deformity to the left eye. Anterior chambers appear unremarkable. Normal extraocular movements. ENT   Head: Normocephalic and atraumatic.   Nose: No congestion/rhinnorhea.   Mouth/Throat: Mucous membranes are moist.   Neck: No stridor. Cardiovascular: Normal rate, regular rhythm. Normal and symmetric distal pulses are present in all extremities. No murmurs, rubs, or gallops. Respiratory: Normal respiratory effort without tachypnea nor retractions. Breath sounds are clear and equal bilaterally. No wheezes/rales/rhonchi. Gastrointestinal: Soft and nontender. No distention. No abdominal bruits.  Musculoskeletal: Nontender with normal range of motion in all extremities. No joint effusions.  No lower extremity tenderness nor edema. Neurologic:  Normal speech and language. No gross focal neurologic deficits are appreciated.  Skin:  Skin is warm, dry and intact. No rash noted. Psychiatric: Mood and affect are normal.  Speech and behavior are normal. Patient exhibits appropriate insight and judgment. ____________________________________________  ED COURSE:  Pertinent labs & imaging results that were available during my care of the patient were reviewed by me and considered in my medical decision making (see chart for details). Unclear etiology for headaches that have been worsening recently. This is not consistent with temporal  arteritis or glaucoma. She has no eye pain. We will obtain CT imaging and reevaluate. ____________________________________________   RADIOLOGY Images were viewed by me CT head is unremarkable  ____________________________________________  FINAL ASSESSMENT AND PLAN  Headache  Plan: Patient with imaging as dictated above. Patient denies specific headaches that have been going on since January. We'll prescribe some Fioricet and refer her to neurology for further evaluation. These are likely migraine headaches.   Emily FilbertWilliams, Sheila Gervasi E, MD   Emily FilbertJonathan E Teala Daffron, MD 02/20/16 [phone]

## 2016-02-20 NOTE — ED Notes (Addendum)
Per pt's daughters pt began having a severe HA last night. Per daughter's pt has a hx of TIA that began with a bad HA. Pt facial symmetry intact, grip strength equal bilaterally. Pt's pupil round and reactive to light at this time. Pt states light and noise sensitivity at this time.

## 2016-03-23 DIAGNOSIS — Z8673 Personal history of transient ischemic attack (TIA), and cerebral infarction without residual deficits: Secondary | ICD-10-CM | POA: Diagnosis not present

## 2016-03-23 DIAGNOSIS — I10 Essential (primary) hypertension: Secondary | ICD-10-CM | POA: Diagnosis not present

## 2016-03-23 DIAGNOSIS — R531 Weakness: Secondary | ICD-10-CM | POA: Diagnosis not present

## 2016-03-23 DIAGNOSIS — R262 Difficulty in walking, not elsewhere classified: Secondary | ICD-10-CM

## 2016-08-16 ENCOUNTER — Ambulatory Visit: Payer: Medicare Other | Admitting: Internal Medicine

## 2017-10-02 IMAGING — MR MR MRA HEAD W/O CM
11 of 12 series · 34 of 48 positions shown · non-contrast
Comparison: CT head without contrast 01/05/2006.

CLINICAL DATA: Headaches.  Abnormal CT of the head.

EXAM:
MRI HEAD WITHOUT CONTRAST
MRA HEAD WITHOUT CONTRAST
TECHNIQUE: Multiplanar, multiecho pulse sequences of the brain and surrounding
structures were obtained without intravenous contrast. Angiographic
images of the head were obtained using MRA technique without
contrast.

[Series 2: T1 · sagittal · 5.0mm · 0.45mm/px · 3 of 29 slices shown (1 of 2)]
[im 1/29]
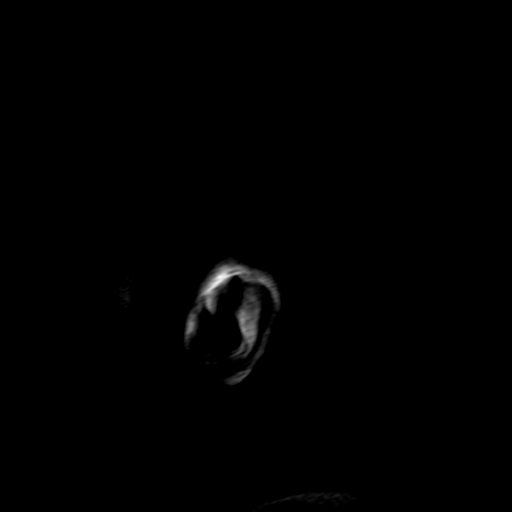
[im 15/29]
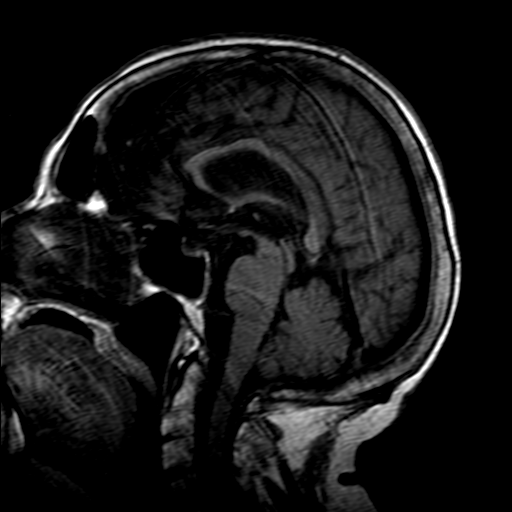
[im 29/29]
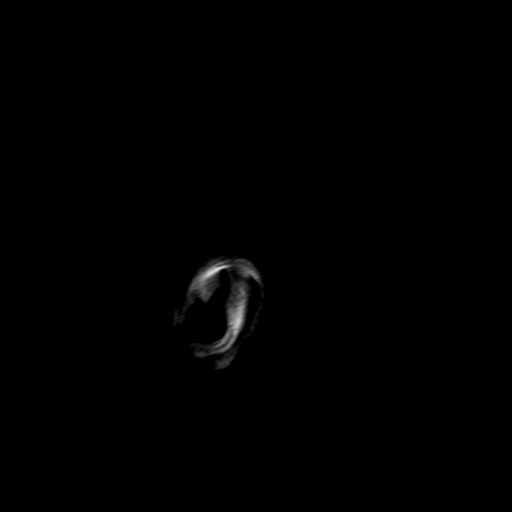

[Series 4: DWI · axial · 4.0mm · 0.94mm/px · z∈[-74,+94]mm · 4 of 45 slices shown (1 of 4)]
[im 1/45]
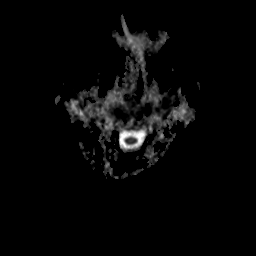
[im 15/45]
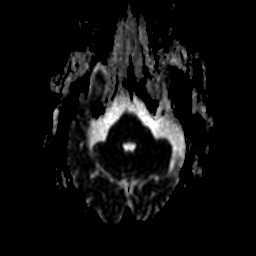
[im 30/45]
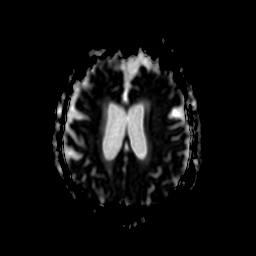
[im 45/45]
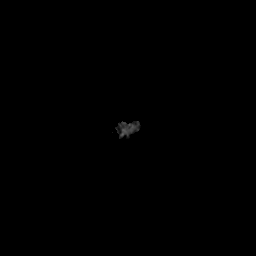

[Series 6: DWI · coronal · 5.0mm · 1.80mm/px · 4 of 40 slices shown (2 of 4)]
[im 1/40]
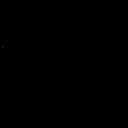
[im 14/40]
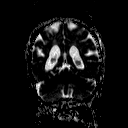
[im 27/40]
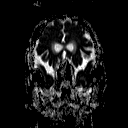
[im 40/40]
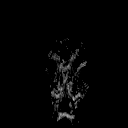

[Series 7: DWI · axial · 4.0mm · 0.94mm/px · z∈[-74,+94]mm · 4 of 45 slices shown (3 of 4)]
[im 1/45]
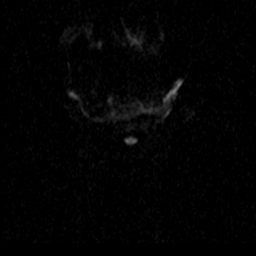
[im 15/45]
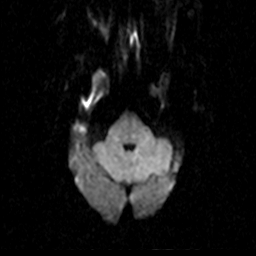
[im 30/45]
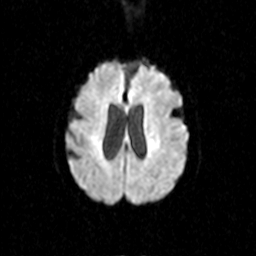
[im 45/45]
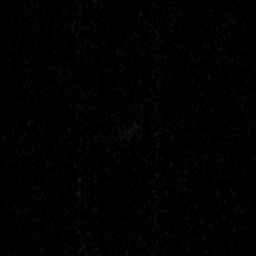

[Series 8: DWI · coronal · 5.0mm · 1.80mm/px · 3 of 37 slices shown (4 of 4)]
[im 1/37]
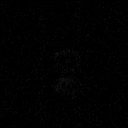
[im 19/37]
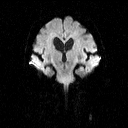
[im 37/37]
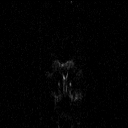

[Series 14: T2 · axial · 5.0mm · 0.45mm/px · z∈[-61,+97]mm · 2 of 27 slices shown (1 of 3)]
[im 1/27]
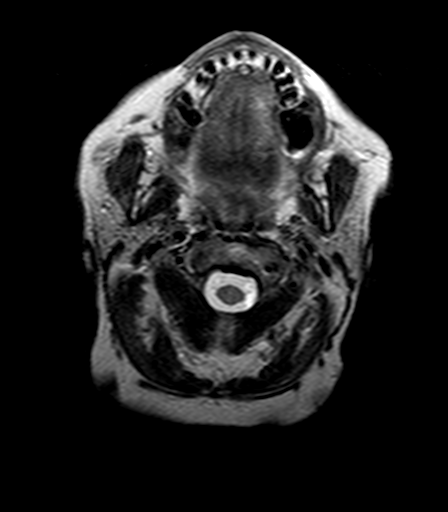
[im 27/27]
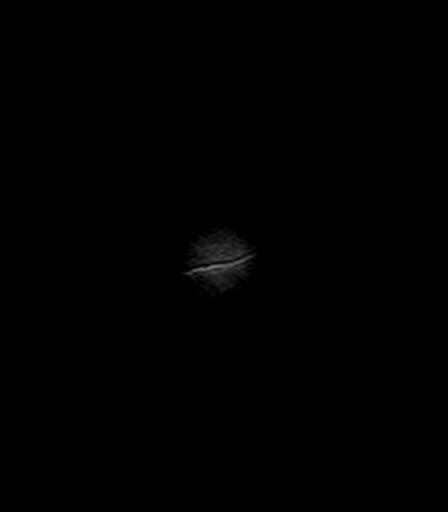

[Series 16: FLAIR · axial · 5.0mm · 0.90mm/px · z∈[-60,+97]mm · 2 of 27 slices shown]
[im 1/27]
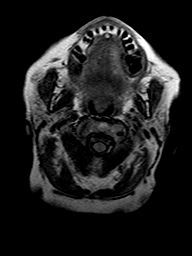
[im 27/27]
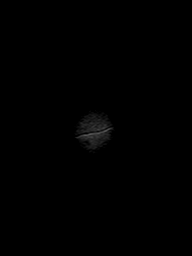

[Series 18: T2 · axial · 5.0mm · 0.45mm/px · z∈[-61,+97]mm · 2 of 27 slices shown (2 of 3)]
[im 1/27]
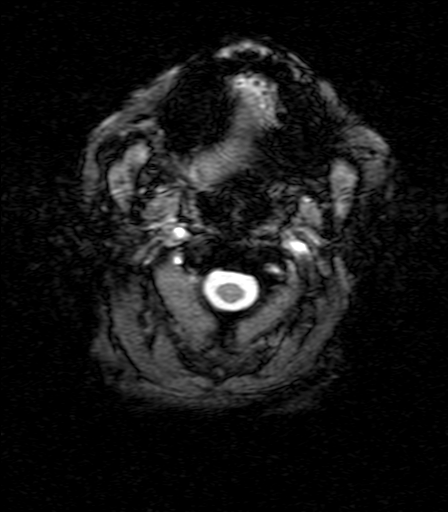
[im 27/27]
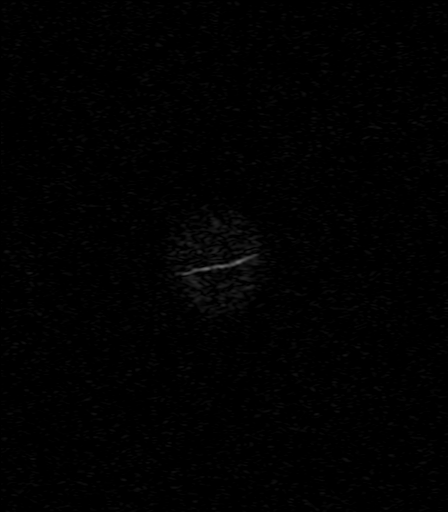

[Series 22: T1 · axial · 3.0mm · 0.45mm/px · z∈[-64,+100]mm · 6 of 60 slices shown (2 of 2)]
[im 1/60]
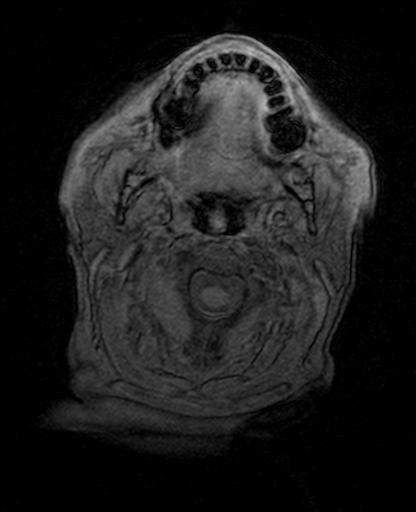
[im 12/60]
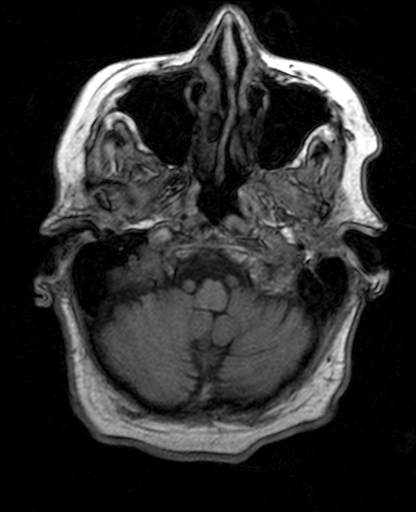
[im 24/60]
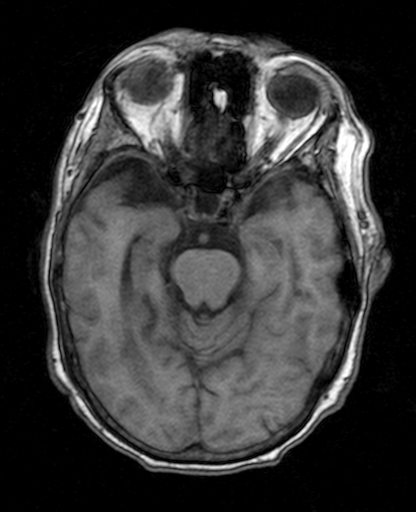
[im 36/60]
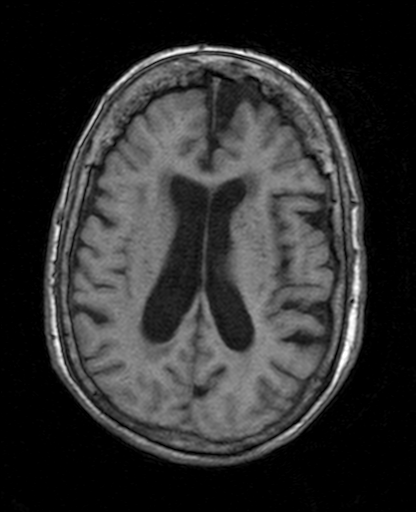
[im 48/60]
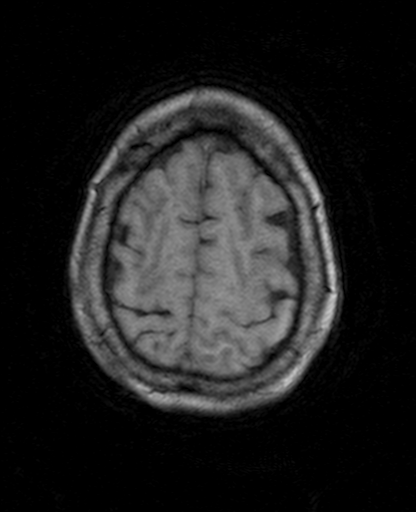
[im 60/60]
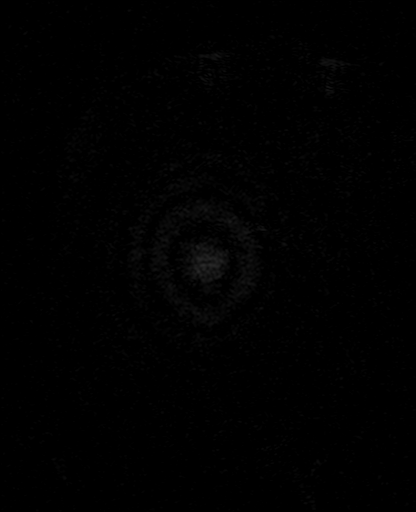

[Series 23: tumble post circ · axial · 0.52mm/px · 1 of 1 slices shown]
[im 1/1]
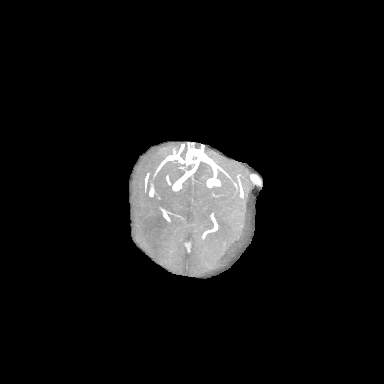

[Series 24: T2 · coronal · 5.0mm · 0.51mm/px · 3 of 29 slices shown (3 of 3)]
[im 1/29]
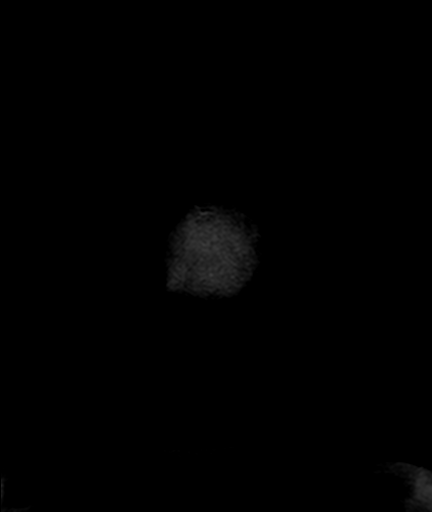
[im 15/29]
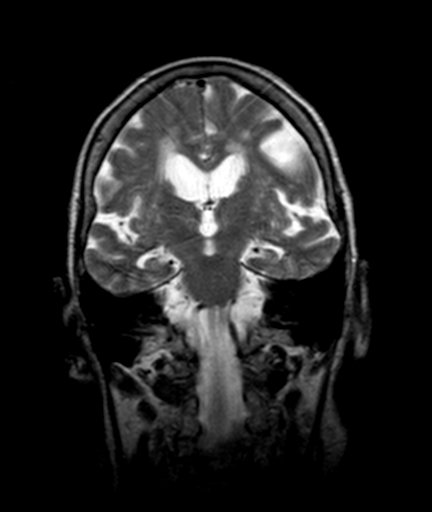
[im 29/29]
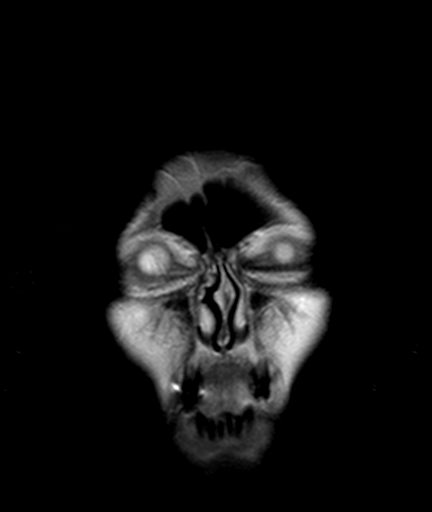

[34 of 48 positions shown; findings below may reference images not displayed]

FINDINGS: MRI HEAD FINDINGS

The diffusion-weighted images demonstrate no evidence for acute or
subacute infarct. Dilated perivascular spaces and remote infarcts
are evident in the inferior aspect of the lentiform nucleus and
claustrum. Remote white matter changes are present in the left
external capsule. Dilated perivascular spaces are present throughout
both basal ganglia. Remote ischemic changes are present of [place].

Moderate periventricular and subcortical white matter changes are
present bilaterally.

The internal auditory canals are within normal limits. Flow is
present in the major intracranial arteries.

Bilateral lens replacements are present. The globes and orbits are
intact.

Paranasal sinuses and mastoid air cells are clear. The skullbase is
within normal limits. Midline sagittal images demonstrate mild
atrophy of the corpus callosum. There is a relatively empty sella.
No discrete lesions are evident.

MRA HEAD FINDINGS

An atherosclerotic irregularities are present within the cavernous
internal carotid arteries bilaterally without a significant focal
stenosis. Posterior communicating arteries are present bilaterally.
Mild irregularity is present in the distal M1 segments. This is
somewhat exaggerated by patient motion. There is moderate
attenuation of distal MCA branch vessels bilaterally without a
significant proximal stenosis or occlusion on the right. There is
mild-to-moderate narrowing of the superior left M2 segment after an
initial inferior and to take golf.

The right vertebral artery is the dominant vessel. There is moderate
narrowing at the origin of the PICA vessels bilaterally. The
posterior she ***---*** the the basilar artery is normal. There is
mild narrowing of the proximal P1 segments bilaterally. Prominent
posterior communicating arteries are present bilaterally. There is
moderate signal loss in the distal P2 segments bilaterally. This is
exaggerated by patient motion through this area.
IMPRESSION: 1. No acute intracranial abnormality. Specifically, no evidence for
acute left basal ganglia infarct.
2. Remote lacunar infarcts in dilated perivascular spaces are
present throughout the basal ganglia bilaterally, particularly
within the inferior left lentiform nucleus and external capsule.
3. Moderate diffuse periventricular and subcortical white matter
changes bilaterally. These correspond with extensive distal media
min small vessel disease on the MRA.
4. Moderate narrowing of the superior division proximal left M2
segment.
5. Moderate narrowing of the PICA origins bilaterally.
6. No other significant proximal stenosis, aneurysm, or branch
vessel occlusion.

## 2018-11-11 DEATH — deceased
